# Patient Record
Sex: Female | Born: 1970 | Race: White | Hispanic: No | Smoking: Current some day smoker
Health system: Southern US, Community
[De-identification: ages and names within clinical notes are randomized; demographics above are authoritative.]

## PROBLEM LIST (undated history)

## (undated) DIAGNOSIS — T4145XA Adverse effect of unspecified anesthetic, initial encounter: Secondary | ICD-10-CM

## (undated) DIAGNOSIS — R200 Anesthesia of skin: Secondary | ICD-10-CM

## (undated) DIAGNOSIS — G8929 Other chronic pain: Secondary | ICD-10-CM

## (undated) DIAGNOSIS — M1990 Unspecified osteoarthritis, unspecified site: Secondary | ICD-10-CM

## (undated) DIAGNOSIS — E119 Type 2 diabetes mellitus without complications: Secondary | ICD-10-CM

## (undated) DIAGNOSIS — D649 Anemia, unspecified: Secondary | ICD-10-CM

## (undated) DIAGNOSIS — J45909 Unspecified asthma, uncomplicated: Secondary | ICD-10-CM

## (undated) DIAGNOSIS — T8859XA Other complications of anesthesia, initial encounter: Secondary | ICD-10-CM

## (undated) DIAGNOSIS — I10 Essential (primary) hypertension: Secondary | ICD-10-CM

## (undated) DIAGNOSIS — K219 Gastro-esophageal reflux disease without esophagitis: Secondary | ICD-10-CM

## (undated) DIAGNOSIS — M549 Dorsalgia, unspecified: Secondary | ICD-10-CM

## (undated) HISTORY — DX: Type 2 diabetes mellitus without complications: E11.9

## (undated) HISTORY — PX: ARM WOUND REPAIR / CLOSURE: SUR1141

## (undated) HISTORY — PX: OTHER SURGICAL HISTORY: SHX169

## (undated) HISTORY — PX: NECK SURGERY: SHX720

---

## 1999-11-26 ENCOUNTER — Encounter: Payer: Self-pay | Admitting: Emergency Medicine

## 1999-11-26 ENCOUNTER — Emergency Department (HOSPITAL_COMMUNITY): Admission: EM | Admit: 1999-11-26 | Discharge: 1999-11-26 | Payer: Self-pay | Admitting: Emergency Medicine

## 2007-01-09 ENCOUNTER — Inpatient Hospital Stay: Payer: Self-pay | Admitting: Unknown Physician Specialty

## 2008-09-20 ENCOUNTER — Ambulatory Visit: Payer: Self-pay | Admitting: Gastroenterology

## 2009-07-27 ENCOUNTER — Emergency Department: Payer: Self-pay

## 2009-08-09 ENCOUNTER — Ambulatory Visit: Payer: Self-pay | Admitting: Gastroenterology

## 2009-12-07 ENCOUNTER — Ambulatory Visit: Payer: Self-pay | Admitting: Gastroenterology

## 2010-03-29 ENCOUNTER — Ambulatory Visit: Payer: Self-pay | Admitting: Gastroenterology

## 2011-02-13 ENCOUNTER — Emergency Department: Payer: Self-pay | Admitting: Emergency Medicine

## 2011-02-26 ENCOUNTER — Ambulatory Visit: Payer: Self-pay | Admitting: Internal Medicine

## 2011-02-27 ENCOUNTER — Emergency Department: Payer: Self-pay | Admitting: Emergency Medicine

## 2011-03-14 ENCOUNTER — Ambulatory Visit: Payer: Self-pay

## 2011-07-11 ENCOUNTER — Ambulatory Visit: Payer: Self-pay | Admitting: Unknown Physician Specialty

## 2011-07-18 ENCOUNTER — Ambulatory Visit: Payer: Self-pay | Admitting: Unknown Physician Specialty

## 2012-04-20 ENCOUNTER — Ambulatory Visit: Payer: Self-pay | Admitting: Internal Medicine

## 2012-06-17 ENCOUNTER — Ambulatory Visit: Payer: Self-pay | Admitting: Orthopedic Surgery

## 2012-06-17 LAB — BASIC METABOLIC PANEL
Anion Gap: 7 (ref 7–16)
BUN: 10 mg/dL (ref 7–18)
Calcium, Total: 9.3 mg/dL (ref 8.5–10.1)
Chloride: 107 mmol/L (ref 98–107)
Co2: 21 mmol/L (ref 21–32)
Creatinine: 0.77 mg/dL (ref 0.60–1.30)
EGFR (African American): >60
EGFR (Non-African Amer.): >60
Glucose: 109 mg/dL — ABNORMAL HIGH (ref 65–99)
Osmolality: 270 (ref 275–301)
Potassium: 3.9 mmol/L (ref 3.5–5.1)
Sodium: 135 mmol/L — ABNORMAL LOW (ref 136–145)

## 2012-06-17 LAB — HEMOGLOBIN: HGB: 15.2 g/dL (ref 12.0–16.0)

## 2012-06-30 ENCOUNTER — Ambulatory Visit: Payer: Self-pay | Admitting: Orthopedic Surgery

## 2012-06-30 LAB — PREGNANCY, URINE: Pregnancy Test, Urine: NEGATIVE m[IU]/mL

## 2012-08-03 ENCOUNTER — Inpatient Hospital Stay: Payer: Self-pay | Admitting: Internal Medicine

## 2012-08-03 LAB — CBC
HCT: 33.8 % — ABNORMAL LOW (ref 40.0–52.0)
HGB: 11.8 g/dL — ABNORMAL LOW (ref 12.0–16.0)
MCH: 30 pg (ref 26.0–34.0)
MCHC: 34.8 g/dL (ref 32.0–36.0)
MCV: 86 fL (ref 80–100)
Platelet: 237 10*3/uL (ref 150–440)
RBC: 3.93 10*6/uL — ABNORMAL LOW (ref 4.40–5.90)
RDW: 14 % (ref 11.5–14.5)
WBC: 7.1 10*3/uL (ref 3.8–10.6)

## 2012-08-03 LAB — COMPREHENSIVE METABOLIC PANEL
Albumin: 3.5 g/dL (ref 3.4–5.0)
Alkaline Phosphatase: 81 U/L (ref 50–136)
Anion Gap: 10 (ref 7–16)
BUN: 10 mg/dL (ref 7–18)
Bilirubin,Total: 0.2 mg/dL (ref 0.2–1.0)
Calcium, Total: 8.8 mg/dL (ref 8.5–10.1)
Chloride: 110 mmol/L — ABNORMAL HIGH (ref 98–107)
Co2: 22 mmol/L (ref 21–32)
Creatinine: 0.71 mg/dL (ref 0.60–1.30)
EGFR (African American): >60
EGFR (Non-African Amer.): >60
Glucose: 157 mg/dL — ABNORMAL HIGH (ref 65–99)
Osmolality: 285 (ref 275–301)
Potassium: 3.7 mmol/L (ref 3.5–5.1)
SGOT(AST): 19 U/L (ref 15–37)
SGPT (ALT): 32 U/L (ref 12–78)
Sodium: 142 mmol/L (ref 136–145)
Total Protein: 7 g/dL (ref 6.4–8.2)

## 2012-08-03 LAB — TROPONIN I: Troponin-I: <0.02 ng/mL

## 2012-08-03 LAB — CK TOTAL AND CKMB (NOT AT ARMC)
CK, Total: 92 U/L (ref 21–232)
CK-MB: 1.5 ng/mL (ref 0.5–3.6)

## 2012-08-04 LAB — LIPID PANEL
Cholesterol: 144 mg/dL (ref 0–200)
HDL Cholesterol: 23 mg/dL — ABNORMAL LOW (ref 40–60)
Triglycerides: 457 mg/dL — ABNORMAL HIGH (ref 0–200)

## 2012-08-04 LAB — HEMOGLOBIN A1C: Hemoglobin A1C: 6 % (ref 4.2–6.3)

## 2012-08-04 LAB — TSH: Thyroid Stimulating Horm: 1.52 u[IU]/mL

## 2012-08-04 LAB — T4, FREE: Free Thyroxine: 0.86 ng/dL (ref 0.76–1.46)

## 2012-08-05 LAB — SEDIMENTATION RATE: Erythrocyte Sed Rate: 46 mm/hr — ABNORMAL HIGH (ref 0–20)

## 2012-08-06 LAB — BASIC METABOLIC PANEL
Anion Gap: 8 (ref 7–16)
BUN: 11 mg/dL (ref 7–18)
Calcium, Total: 9.4 mg/dL (ref 8.5–10.1)
Chloride: 104 mmol/L (ref 98–107)
Co2: 26 mmol/L (ref 21–32)
Glucose: 113 mg/dL — ABNORMAL HIGH (ref 65–99)
Osmolality: 276 (ref 275–301)
Potassium: 4.2 mmol/L (ref 3.5–5.1)
Sodium: 138 mmol/L (ref 136–145)

## 2012-08-06 LAB — CBC WITH DIFFERENTIAL/PLATELET
Basophil %: 0.6 %
Eosinophil #: 0.1 10*3/uL (ref 0.0–0.7)
Eosinophil %: 1.7 %
HGB: 12.9 g/dL (ref 12.0–16.0)
Lymphocyte #: 2.1 10*3/uL (ref 1.0–3.6)
MCH: 29.6 pg (ref 26.0–34.0)
MCV: 85 fL (ref 80–100)
Monocyte #: 0.5 10*3/uL (ref 0.2–1.0)
Platelet: 285 10*3/uL (ref 150–440)
RBC: 4.35 10*6/uL — ABNORMAL LOW (ref 4.40–5.90)

## 2012-08-06 LAB — TSH: Thyroid Stimulating Horm: 3.95 u[IU]/mL

## 2012-10-12 ENCOUNTER — Emergency Department: Payer: Self-pay | Admitting: Emergency Medicine

## 2013-07-03 ENCOUNTER — Ambulatory Visit: Payer: Self-pay | Admitting: Neurology

## 2013-10-12 ENCOUNTER — Other Ambulatory Visit: Payer: Self-pay | Admitting: Neurosurgery

## 2013-10-12 DIAGNOSIS — M5416 Radiculopathy, lumbar region: Secondary | ICD-10-CM

## 2014-03-16 ENCOUNTER — Ambulatory Visit: Payer: Self-pay | Admitting: Pain Medicine

## 2014-03-16 LAB — HEPATIC FUNCTION PANEL A (ARMC)
Albumin: 4.2 g/dL (ref 3.4–5.0)
Alkaline Phosphatase: 54 U/L
Bilirubin, Direct: <0.1 mg/dL (ref 0.00–0.20)
Bilirubin,Total: 0.2 mg/dL (ref 0.2–1.0)
SGOT(AST): 23 U/L (ref 15–37)
SGPT (ALT): 25 U/L (ref 12–78)
TOTAL PROTEIN: 7.8 g/dL (ref 6.4–8.2)

## 2014-03-16 LAB — BASIC METABOLIC PANEL
Anion Gap: 8 (ref 7–16)
BUN: 17 mg/dL (ref 7–18)
CALCIUM: 10 mg/dL (ref 8.5–10.1)
CREATININE: 0.99 mg/dL (ref 0.60–1.30)
Chloride: 105 mmol/L (ref 98–107)
Co2: 23 mmol/L (ref 21–32)
Glucose: 194 mg/dL — ABNORMAL HIGH (ref 65–99)
Osmolality: 279 (ref 275–301)
Potassium: 4 mmol/L (ref 3.5–5.1)
Sodium: 136 mmol/L (ref 136–145)

## 2014-03-16 LAB — SEDIMENTATION RATE: ERYTHROCYTE SED RATE: 12 mm/h (ref 0–20)

## 2014-03-16 LAB — MAGNESIUM: MAGNESIUM: 1.5 mg/dL — AB

## 2014-03-31 ENCOUNTER — Ambulatory Visit: Payer: Self-pay | Admitting: Pain Medicine

## 2014-04-11 ENCOUNTER — Ambulatory Visit: Payer: Self-pay | Admitting: Pain Medicine

## 2014-04-14 ENCOUNTER — Other Ambulatory Visit: Payer: Self-pay | Admitting: Pain Medicine

## 2014-04-14 LAB — HEMOGLOBIN A1C: HEMOGLOBIN A1C: 6.7 % — AB (ref 4.2–6.3)

## 2014-04-19 ENCOUNTER — Ambulatory Visit: Payer: Self-pay | Admitting: Pain Medicine

## 2014-05-09 ENCOUNTER — Ambulatory Visit: Payer: Self-pay | Admitting: Pain Medicine

## 2014-05-24 ENCOUNTER — Ambulatory Visit: Payer: Self-pay | Admitting: Pain Medicine

## 2014-06-22 ENCOUNTER — Ambulatory Visit: Payer: Self-pay | Admitting: Pain Medicine

## 2014-07-26 ENCOUNTER — Ambulatory Visit: Payer: Self-pay | Admitting: Pain Medicine

## 2014-08-31 ENCOUNTER — Ambulatory Visit: Payer: Self-pay | Admitting: Pain Medicine

## 2014-10-14 ENCOUNTER — Ambulatory Visit: Payer: Self-pay | Admitting: Pain Medicine

## 2014-11-18 ENCOUNTER — Ambulatory Visit: Payer: Self-pay | Admitting: Pain Medicine

## 2014-12-21 ENCOUNTER — Ambulatory Visit: Payer: Self-pay | Admitting: Internal Medicine

## 2015-01-06 ENCOUNTER — Emergency Department: Payer: Self-pay | Admitting: Emergency Medicine

## 2015-02-21 NOTE — Discharge Summary (Signed)
PATIENT NAME:  Colleen Mejia, Colleen Mejia MR#:  [id number] DATE OF BIRTH:  [birth year]  DATE OF ADMISSION:  08/03/2012 DATE OF DISCHARGE:  08/06/2012  ADMITTING PHYSICIAN:  Samson Frederic, DO  DISCHARGING PHYSICIAN: Gladstone Lighter, MD  PRIMARY CARE PHYSICIAN: Clayborn Bigness.  [place]:  Neurology consultation by Dr. Jennings Books   DISCHARGE DIAGNOSES:  1. Left-sided headache with facial numbness - presentation of atypical migraine.  2. Chronic nausea and dizziness.  3. Diabetic gastroparesis.  4. Diabetes mellitus with hemoglobin A1c of 6.0.  5. Hypothyroidism.  6. Hyperlipidemia.  7. Asthma.  8. Skin macular rash developed, possibly secondary to Fioricet. 9. Cocaine abuse.  10. History of irritable bowel syndrome with chronic diarrhea.   DISCHARGE MEDICATIONS:  1. Synthroid 50 mcg p.o. daily.  2. Singulair 10 mg p.o. daily.  3. Omeprazole 20 mg, 2 capsules in the morning.  4. Flonase 50 mcg nasal spray two sprays each nostril once in the morning.  5. Zetia 10 mg every day.  6. Glyburide-metformin 2.5 x 500 mg 1 tablet b.i.d.  7. Promethazine 25 mg every six hours as needed for nausea, and motion sickness.  8. Percocet 5/325 milligrams tablets 1 tablet every six hours p.r.n. for pain.  9. Prednisone 60 mg oral daily and taper off x 10 mg every day.   DISCHARGE DIET: Low-sodium diet.   DISCHARGE ACTIVITY: As tolerated.    FOLLOWUP INSTRUCTIONS:  1. Primary care physician follow-up in 1 to 2 weeks.  2. Follow up with neurologist, Dr. Jennings Books in two weeks.  3. Follow up with gastroenterology for chronic nausea in 2 to 3 weeks.    LABS AND IMAGING STUDIES:  1. WBC 8.2.  2. Hemoglobin 12.9, hematocrit 37.1, platelet count 285.  TSH is 3.95. 3. Sodium 138, potassium 4.2, chloride 104, bicarbonate 26, BUN 11, creatinine 0.85, glucose 113, calcium 9.4. 4. Vitamin B2 of was within normal limits at 369 pg/mL.  5. MRI of the brain with and without contrast showing  no acute infarct, no acute intracranial findings. No abnormal areas of  enhancement are seen  ESR slightly elevated at 46. ANA panel was negative. Ultrasound Doppler carotid bilaterally showing no evidence of hemodynamically significant carotid artery stenosis. Echo Doppler showing normal LV systolic function, EF greater than 55%, trace tricuspid regurgitation and mild mitral regurgitation are present. CK and CK-MB and troponins were within normal limits. LFTs were within normal limits as repeated. LDL elevated and cannot be calculated because  triglycerides 457. HbA1c 6.0.   BRIEF HOSPITAL COURSE: Ms. Shiroma is a 44 year old obese Caucasian female with past medical history significant for diabetes mellitus, hyperlipidemia, and history of cocaine abuse with irritable bowel syndrome and chronic nausea who presented to the ER secondary to left-sided facial numbness associated with headache and also and left arm and leg numbness. Her CT was negative on admission.  1. Left-sided paresthesias, initially admitted for possible transient ischemic attack. Had neuro checks. Her tingling, numbness, resolved. She had an MRI of the brain which did not show any acute infarct and there were no chronic microvascular ischemic changes as well. Echocardiogram and carotid Dopplers were normal as well. Her headache that was associated with her left facial numbness was very atypical. The patient denied any history of migraine so neurology was consulted. Per Dr Jennings Books, he also felt that this was an atypical migraine, and he ordered one dose of IV Depakote following which the next day the patient's headache improved a lot and  her facial numbness has resolved. She is being discharged on prednisone taper and will follow up with Dr. Manuella Ghazi as an outpatient.  2. Allergic reaction, the patient had a rash prior to discharge. The only new medication she has received was IV Depakote which was given more than 24 hours ago and she was also  getting p.r.n., Percocet and Fioricet of which she got two doses. Percocet, she did use in the past as she had a recent right knee surgery done for torn ACL ligament and she denies any allergies to that. However, her with some Benadryl and one dose of IV Solu-Medrol her rash has resolved and she wanted to go home. So Fioricet was discontinued at this time and she was asked to take as needed ibuprofen for her headache and follow-up with Dr. Jennings Books if her headache worsens. She was given the option of staying for one more night in the hospital to make sure that Fioricet was the only drug she is allergic to; however, the patient refused, as her rash improved. She wanted to go home.  3. Chronic nausea, dizziness, and irritable bowel syndrome. She says it has been going on for more than 10 years now. Phenergan  was prescribed for p.r.n. nausea symptoms and advised to follow up with gastroenterology. 4. Hypothyroidism and hyperlipidemia. All her home medications were continued.  5. Her course has been otherwise uneventful in the hospital.   DISCHARGE CONDITION: Stable.   DISCHARGE DISPOSITION: Home.   Time Spent ON discharge: 40 minutes.  ____________________________ Gladstone Lighter, MD rk:ljs D: 08/13/2012 14:47:42 ET T: 08/14/2012 11:39:11 ET JOB#: [id number]  cc: Gladstone Lighter, MD, <Dictator> Hemang K. Manuella Ghazi, MD Lavera Guise, MD Gladstone Lighter MD ELECTRONICALLY SIGNED 08/15/2012 7:28

## 2015-02-21 NOTE — H&P (Signed)
PATIENT NAME:  Colleen Mejia, Colleen Mejia MR#:  [id number] DATE OF BIRTH:  [birth year]  DATE OF ADMISSION:  08/04/2012  PRIMARY CARE PHYSICIAN:  Dr. Clayborn Bigness.   CHIEF COMPLAINT: "I think I had a mini stroke on Friday."   HISTORY OF PRESENT ILLNESS: 44 year old female with history of diabetes mellitus, dyslipidemia who presents with constellation of neurological symptoms that developed four days prior to presentation. She reports that on Friday she suddenly developed cold sweats, felt her jaw drop towards the right side, had left facial numbness. The patient recalls that symptoms started with her dropping something she was holding in her left hand and then she developed slobbering, could not talk.  She was very confused. She could not talk. She could not move her eyes down and it lasted about 20 minutes. It resolved, but then recurred within another 15 minutes and persisted for about the same 20 to 30 minutes. After the episode, she felt very exhausted. She recalls that she tried to drink water, but gagged on it and actually subsequently just had emesis of all the water that she had attempted to drink. Since then she has had left facial numbness, ear fluttering, left-sided headaches and left neck soreness as well as imbalance. She presented to her primary care provider today who called EMS on her behalf and she was sent directly from the office to the Emergency Department via EMS.   The patient recalls that preceding these events she had had about 2 to 3 weeks of diarrhea associated with abdominal pain. She could not eat at all and she estimates that she might have lost about 20 pounds. She did not seek medical care during that time. She remembers that during that time she would have fevers and cold sweats intermittently and she thought she was having the flu.   In the Emergency Department we are being called to admit her for concern of cerebrovascular accident versus cerebrovascular accident/transient ischemic  attack.   The patient does admit to daily tobacco use. She admits to smoking cocaine. However, she notes that she did not have any cocaine or illicit drugs prior to developing the above neurological symptoms.   PAST MEDICAL HISTORY:  1. Diabetes mellitus.  2. Dyslipidemia.  3. Asthma.  4. Hypothyroidism.  5. Substance abuse (cocaine).  6. Internal hemorrhoids.  7. History of irritable bowel syndrome with chronic diarrhea. 8. Tobacco abuse.  PAST SURGICAL HISTORY:  1. Right anterior cruciate ligament repair three weeks ago 08/27.  2. Anterior cervical diskectomy with fusion C6-C7 with insertion of interbody plate device.  3. Colonoscopy.  ALLWERGIES: NO KNOWN DRUG ALLERGIES. SEASONAL ALLERGIES.  MEDICATIONS:  1. Metformin/Glyburide 500 mg/2.5 mg twice a day.  2. Zetia 10 mg daily.  3. Singulair 10 mg daily.  4. Flonase 50 mcg two sprays in each nostril daily.  5. Omeprazole 20 mg daily.  6. Synthroid 50 micrograms daily.   FAMILY HISTORY:  Diabetes in grandmother, father and cousins. Maternal grandmother had lung cancer. She denies family history of cerebrovascular accident, myocardial infarction, bleeding or clotting disorders.   SOCIAL HISTORY: 10 cigarettes per day since age 62. She denies alcohol. Admits to illicit drug use. Last use was a few days ago and was cocaine which she smokes. She denies IV drug abuse.   REVIEW OF SYSTEMS: CONSTITUTIONAL: Admits to fever, chills, fatigue, weight loss, left-sided weakness. EYES: She admits to chronic blurred vision. She says it is always blurred due to her diabetes. Denies eye pain. ENT: Admits to  some ear ringing since the symptoms developed four days ago and some imbalance. RESPIRATORY: Has intermittent wheeze and dyspnea due to asthma, but no frank cough. CARDIOVASCULAR: Denies chest pain, edema or arrhythmia. GASTROINTESTINAL: Admits to nausea, vomiting x1 and profuse diarrhea over the preceding three weeks that has resolved, also  associated with some abdominal pain. She denies any blood. GU: Denies dysuria. ENDOCRINE: Admits to cold sweats. MUSCULOSKELETAL: Admits to left neck pain as well as right knee pain. The right knee pain is due to recent knee surgery. NEUROLOGIC: Admits to numbness in her face, weakness in the left arm, dysarthria, headaches, and feeling confused.   PHYSICAL EXAMINATION:    VITAL SIGNS: Temperature 98.4, blood pressure 125/81, heart rate 87, respiratory rate 18, sating 97% on room air.   GENERAL: She is a well-appearing Caucasian female in no apparent distress. She is obese.   EYES: Pupils equally round and reactive to light and accommodation. Extraocular muscles are intact. Anicteric sclerae. Normal eyelids.   ENT: Normal external ears and nares. Posterior oropharynx is clear. There was slight tongue deviation to the right, very slight.   CARDIOVASCULAR: S1 and S2, regular rate and rhythm. No murmurs appreciated. There is no pretibial edema.   RESPIRATORY: Clear to auscultation bilaterally. There is normal respiratory effort.   ABDOMEN: Soft, nontender, nondistended. There is no hepatosplenomegaly noted.   NEUROLOGIC: There is no dysarthria. Extraocular muscles are intact. There is facial numbness on the left side. The patient characterizes this "feeling like I'm at the dentist and just got a Novocaine injection. I can feel it, but it feels different". She has equal sternocleidomastoid strength and shoulder shrug. There is equal palatal lift. Hearing is intact bilaterally. There is slight tongue deviation to the right. She has full strength in right upper extremity proximal and distally in flexion and extension. However, she has decreased hand grip in the left side. Sensation is intact and strength is intact bilateral lower extremities, although exam is limited on the right lower extremity due to her recent knee surgery and she had some pain there. She had slight pronator drift and slight  dysdiadochokinesia on finger-to-nose with the right-hand. Heel-to-shin and rapid alternating movements were within normal limits, although again, heel to shin for the right lower extremity was limited due to her recent knee surgery.   SKIN: Warm and dry. No lesions. There are some mild papules similar to acne on her upper back.   LYMPHATICS: There is no cervical or inguinal adenopathy noted.   PSYCH: She is awake, alert, oriented to time, place, and situation. Judgment appears intact currently.   MUSCULOSKELETAL: She has normal tone. There is no cyanosis noted. There is no clubbing.   LABORATORY, RADIOLOGICAL AND DIAGNOSTIC DATA: CBC shows WBC count 7.1, hemoglobin 11.8, hematocrit 33.8, platelet count 237, MCV of 86. Troponin-I is less than 0.02. CK 92, CK-MB 1.5. BMP shows glucose 157, BUN 10, creatinine 0.71, sodium 142, potassium 3.7, chloride 110, bicarbonate 22, calcium 8.8, bilirubin 0.2, alkaline phosphatase 81, ALT 32, AST 19, total protein 7, albumin 3.5, osmolality 285, anion gap of 10. CT of head without contrast shows no acute intracranial process. No evidence of mass effect, midline shift or extra-axial fluid collection. There is no evidence of space-occupying lesion, intracranial hemorrhage or cortical-based area of infarction. Chest x-ray shows no evidence of acute cardiopulmonary abnormality.   ASSESSMENT AND PLAN: 44 year old female with diabetes, dyslipidemia, tobacco abuse, cocaine abuse presenting with persistent facial numbness and resolved left-sided weakness, dysarthria, confusion after  four days. Concerning for transient ischemic attack versus cerebrovascular accident.  1. Transient ischemic attack/cerebrovascular accident. The patient has multiple risk factors for developing a cerebrovascular accident including her dyslipidemia, her diabetes, her cocaine abuse, as well as her tobacco use. At this time, we are unable to obtain an MRI as she reportedly had a metal rod in her neck  postop. At this time we will check carotid ultrasounds, check an echocardiogram to rule out embolic event from either possible endocarditis given this diarrheal prodrome illnesses that she reports. I doubt atrial fibrillation is a cause here. However, given her hypothyroidism, we will check a TSH and a FT4. The patient admits to cocaine use. Counseling was provided and she did not accept help, but states that she is going to quit after this event. We will risk stratify her, check fasting lipid panel and hemoglobin A1c. We will start her on a statin. Her blood pressure is currently controlled. We will place her on telemetry to rule out any arrhythmia. Start aspirin. 2. Diabetes mellitus. We will resume her metformin and glyburide.  3. Dyslipidemia. We will continue her Zetia and start statin.  4. Asthma. Resume her Singulair and Flonase.  5. Hypothyroidism. We will resume her Synthroid. Check FT4 and TSH.  6. Possible reflux. We will resume her omeprazole.  7. Substance abuse. Counseling was provided. However, the patient reports that she will quit on her own.  8. Tobacco abuse. Nicotine patch was offered, however, the patient refused stating that "it makes me sick".  9. Prophylaxis. Will be provided with Lovenox.   DISPOSITION: The patient is being admitted inpatient for work-up of transient ischemic attack/cerebrovascular accident.   TIME SPENT: 60 minutes.   ____________________________ Samson Frederic, DO aeo:ap D: 08/03/2012 23:43:28 ET T: 08/04/2012 07:39:45 ET JOB#: [id number]  cc: Samson Frederic, DO, <Dictator> Lavera Guise, MD [place] SIGNED 08/09/2012 0:57

## 2015-02-21 NOTE — Consult Note (Signed)
Brief Consult Note: Diagnosis: left face numbess.   Patient was seen by consultant.   Consult note dictated.   Comments: - migraine ? (very atypical presentation) - ordered MRI brain with and without contrast - for evaluation fo stroke/demyelinating disease etc. 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  Electronic Signatures: Ray Church (MD)  (Signed 01-Oct-13 20:59)  Authored: Brief Consult Note   Last Updated: 01-Oct-13 20:59 by Ray Church (MD)

## 2015-02-21 NOTE — Consult Note (Signed)
PATIENT NAME:  Colleen, Mejia MR#:  [id number] DATE OF BIRTH:  [birth year]  DATE OF CONSULTATION:  08/04/2012  REFERRING PHYSICIAN:  Dr. Levonne Hubert  CONSULTING PHYSICIAN:  Priscille Shadduck K. Manuella Ghazi, MD  REASON FOR CONSULTATION: Left-sided face numbness.   HISTORY OF PRESENT ILLNESS: Colleen Mejia is a 44 year old Caucasian female who started having problems in the early part of September 2013. Initially she felt like she was having significant diarrhea lasting for three weeks and not much nausea but she lost almost 20 pounds during this time.   On Friday, 07/31/2012, she noticed sudden onset of numbness on the left side of her face, left side of the tongue. She felt her jaw was twisted to the right and she was having fluttering sensation in her left ear which lasted for 20 to 30 minutes and then it eased off for 10 to 15 minutes and had a similar spell. She felt exhausted. She "passed out". People told her to go to the ER but she did not go and after four days she went to see her primary care physician who recommended her to go the ER due to her persistent left face numbness.   Patient mentioned that she was having headache during this time period as well, which was associated with light sensitivity, noise sensitivity, irritability, some nausea.   Patient does have a history of rare headaches.   Patient mentioned she had a history of significant neck pain and degenerative disk disease and received ACDF by Dr. Mauri Pole in September 2012.   Patient denied any history of stroke.   Patient is also on disability due to mental health problems. Patient admits doing cocaine in the past.   PAST MEDICAL HISTORY:  1. Diabetes. 2. Hyperlipidemia. 3. Asthma. 4. Hypothyroidism. 5. Cocaine abuse.  6. Internal hemorrhoids. 7. History of irritable bowel syndrome. PAST SURGICAL HISTORY:  1. Right anterior cruciate ligament repair in August 2013.  2. ACDF at C6-C7.  3. Coloscopy.   MEDICATIONS: I reviewed her home  medication list.   FAMILY HISTORY: Significant for diabetes in the mother, father and cousins. Maternal grandmother had lung cancer. She denied any history of stroke MI, bleeding or clotting disorders.   SOCIAL HISTORY: Significant that she smokes 10 cigarettes per day starting at the age of 56. She denies any alcohol. She says yes to illicit drug use with cocaine.   She denied any IV drug abuse.   She lives in an apartment.   REVIEW OF SYSTEMS: Positive for fluttering sensation in the left ear, numbness on the left side of the face, numbness in the right hand, right knee pain. She has irritability to light and noise. She has pain in her neck. Her other 10 system review of systems was asked and was found to be negative.    PHYSICAL EXAMINATION:  VITAL SIGNS: Temperature 96.4, pulse 69, respiratory rate 21, blood pressure 144/84, temperature 98.   GENERAL: She is an obese Caucasian female lying in the bed, not in acute distress. She has a heating pad on her right side of her shoulder.   Patient has multiple scars on her body in her right elbow and right level of the hand region.    She has local tenderness to touch in her right knee.   LUNGS: Clear to auscultation.   HEART: S1, S2 heart sounds. Carotid exam did not reveal any bruit.   NEUROLOGIC: She was alert, oriented, followed two-step inverted commands. Her attention, concentration, and memory was appropriate. I did  not see any neurological neglect.   On her cranial nerves, her pupils are equal, round, and reactive. Extraocular movements are intact. Her face was symmetric. Tongue was midline. Facial sensations are decreased on the left side of the face and the left side of the tongue and inside of the cheek.   Her hearing was intact. Her neck strength was 5/5.   She does have history of scar in her anterior neck region.   On her motor exam she has normal tone. She has restricted efforts on her right lower extremity due to pain.    Otherwise, she has 5/5 strength.   Her sensations were intact to light touch except her right forearm and hand.   Her deep tendon reflexes were symmetric. Her gait was normal.   LABORATORY, DIAGNOSTIC AND RADIOLOGICAL DATA:  Her CT scan of the head was unremarkable.   She has significantly high triglyceride of 457 with low HDL of 23. Otherwise her thyroid function test is okay. Her liver enzymes are okay. She has a low hemoglobin of 11.8.   Echocardiogram was unremarkable.   ASSESSMENT AND PLAN:  1. Left-sided face numbness which is a persistent symptom associated with very unusual spell headache with some migrainous features.   I would like to rule out ischemic disease with MRI of the brain.   I will also give contrast to make sure patient does not have demyelinating disease.   Patient initially thought that she has a contraindication to the MRI due to "metal in her neck" but all the newer metals are compatible with MRI and her surgery was just done in 2012 by Dr. Mauri Pole so I think she should be able to tolerate the MRI.   If her work-up is negative this might represent migraine-related phenomenon.   I will also order vitamin B12, ANA and rheumatoid factor.   2. History of ACDF with some persistent neck pain.  3. Posttraumatic right forearm and hand numbness after surgery.   4. Mental health. Patient is on disability for last three years. Initially she was diagnosed with " schizophrenia" by Dr. 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    This is per patient, I have not reviewed any of her psychiatric notes.   I advised patient that she should have continuous follow up.   I also advised patient on avoiding cocaine, not smoking. She has severe hyperlipidemia and puts her at risk of cerebrovascular and cardiovascular complications in long term. She should be considered on statin.   I will follow this patient with you  less frequently. Feel free to contact me with any further questions.  ____________________________ Royetta Crochet. Manuella Ghazi, MD hks:cms D: 08/04/2012 21:08:38 ET T: 08/05/2012 08:47:06 ET JOB#: [id number]  cc: Omarius Grantham K. Manuella Ghazi, MD, <Dictator> Royetta Crochet [facility] MD ELECTRONICALLY SIGNED 08/06/2012 17:24

## 2015-02-21 NOTE — Op Note (Signed)
PATIENT NAME:  Colleen Mejia, DELEHANTY MR#:  [id number] DATE OF BIRTH:  [birth year]  DATE OF PROCEDURE:  06/30/2012  PREOPERATIVE DIAGNOSIS: Right knee anterior cruciate ligament tear.   POSTOPERATIVE DIAGNOSIS: Right knee anterior cruciate ligament tear.  PROCEDURE: Arthroscopically aided anterior cruciate ligament reconstruction right knee.   SURGEON: Laurene Footman, MD  ANESTHESIA: General.   DESCRIPTION OF PROCEDURE: Patient brought to the Operating Room and after adequate anesthesia was obtained, the right leg was prepped and draped in the usual sterile fashion with the Alvarado legholder utilized with a tourniquet applied to the upper thigh, but not required during the procedure. After preparing the graft using tibialis anterior tendon and the [facility] AperFix system, timeout procedure was carried out and an inferolateral portal was made. On inspection of the knee there was mild patellofemoral chondromalacia. Coming around medially, an inferomedial portal was made. The medial meniscus was intact. In the notch, the anterior cruciate ligament was torn in its mid substance and laterally the lateral compartment was also intact and normal other than some very slight chondromalacia on both femoral and tibial condyles which was also true on the medial side. The gutters were free of any loose bodies. Initial part of procedure involved resecting the torn anterior cruciate ligament from both tibial and femoral sides. After adequate resection had been carried out going through the old footprint on the femoral side a 6 mm guide was inserted and guide pin was inserted with the knee in 120 degrees flexion through the anteromedial portal drilled to 35 mm. The arthroscope was used to check and there was a good blind tunnel present. Next using the tibial guide at 55 degrees a small incision was made and a guidewire inserted through the stump of the anterior cruciate ligament on the tibial side and a 10 mm drill hole made  here as well. The shaver was then used to debride this area around the hole to allow for passage of the implant and graft. The AperFix femoral implant 10 x 29 mm was then inserted into the femoral tunnel to the appropriate level and tightened and on pull test it was stable. A suture had been passed through the tibial tunnel and out the anterior medial portal. This was used to pull the sutures for the three strands of the tendon down through the tibial tunnel. The AperFix 2 cannulated tibial peak implant was then inserted with tension on the grafts at 90 degrees of flexion with a 10 x 30 mm implant inserted. On inspection of the knee the anterior cruciate ligament appeared to be appropriately tensioned. There is no impingement in full extension. There was excellent stability in both 90 and 30 degrees of flexion. The knee was thoroughly irrigated. Excess tendon removed from the end of the tibia tunnel. The wounds were then closed with 2-0 Vicryl subcutaneously and 4-0 nylon for the skin. 20 mL of 0.5% Sensorcaine and 10 mg of morphine were infiltrated into the knee at the close of the case. Sterile dressings of Xeroform, 4 x 4's, Webril, Ace wrap, Polar Care and knee immobilizer were then applied with the knee locked in extension.   ESTIMATED BLOOD LOSS: Minimal.   COMPLICATIONS: None.   SPECIMEN: None.   IMPLANTS: AperFix femoral implant 10 x 29, AperFix 2 cannulated tibial peak implant 10 x 30 mm, with an anterior tibialis allograft tendon.   ____________________________ Laurene Footman, MD mjm:cms D: 06/30/2012 19:02:56 ET T: 07/01/2012 10:14:04 ET JOB#: [id number]  cc: Laurene Footman, MD, <  Dictator>  Laurene Footman MD ELECTRONICALLY SIGNED 07/01/2012 12:03
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
## 2015-07-28 ENCOUNTER — Encounter: Payer: Self-pay | Admitting: Emergency Medicine

## 2015-07-28 ENCOUNTER — Emergency Department
Admission: EM | Admit: 2015-07-28 | Discharge: 2015-07-28 | Disposition: A | Discharge status: Home / Self Care | Payer: Medicare Other | Attending: Emergency Medicine | Admitting: Emergency Medicine

## 2015-07-28 DIAGNOSIS — I10 Essential (primary) hypertension: Secondary | ICD-10-CM | POA: Diagnosis not present

## 2015-07-28 DIAGNOSIS — M62830 Muscle spasm of back: Secondary | ICD-10-CM

## 2015-07-28 DIAGNOSIS — Z720 Tobacco use: Secondary | ICD-10-CM | POA: Diagnosis not present

## 2015-07-28 DIAGNOSIS — M549 Dorsalgia, unspecified: Secondary | ICD-10-CM | POA: Diagnosis present

## 2015-07-28 HISTORY — DX: Unspecified osteoarthritis, unspecified site: M19.90

## 2015-07-28 HISTORY — DX: Essential (primary) hypertension: I10

## 2015-07-28 HISTORY — DX: Other chronic pain: G89.29

## 2015-07-28 HISTORY — DX: Dorsalgia, unspecified: M54.9

## 2015-07-28 MED ORDER — DIAZEPAM 5 MG PO TABS
5.0000 mg | ORAL_TABLET | Freq: Once | ORAL | Status: AC
  Administered 2015-07-28: 5 mg via ORAL
  Filled 2015-07-28: qty 1

## 2015-07-28 MED ORDER — DIAZEPAM 2 MG PO TABS: 2.0000 mg | ORAL_TABLET | Freq: Three times a day (TID) | ORAL | Status: DC | PRN

## 2015-07-28 MED ORDER — NAPROXEN 500 MG PO TABS: 500.0000 mg | ORAL_TABLET | Freq: Two times a day (BID) | ORAL | Status: DC

## 2015-07-28 MED ORDER — NAPROXEN 500 MG PO TABS
500.0000 mg | ORAL_TABLET | Freq: Once | ORAL | Status: AC
  Administered 2015-07-28: 500 mg via ORAL
  Filled 2015-07-28: qty 1

## 2015-07-28 NOTE — ED Provider Notes (Signed)
[facility] Emergency Department Provider Note  ____________________________________________  Time seen: Approximately 7:03 PM  I have reviewed the triage vital signs and the nursing notes.   HISTORY  Chief Complaint Back Pain   HPI Colleen Mejia is a 44 y.o. female who reports to the emergency department for evaluation of back spasms. She states that the spasms are moving and changing places. She states that she has taken Flexeril, but it is not working. She takes hydrocodone that was prescribed to her by the pain clinic, but it is not improving the spasms.   Past Medical History  Diagnosis Date  . Chronic back pain   . Hypertension   . Osteoarthritis     There are no active problems to display for this patient.   History reviewed. No pertinent past surgical history.  Current Outpatient Rx  Name  Route  Sig  Dispense  Refill  . diazepam (VALIUM) 2 MG tablet   Oral   Take 1 tablet (2 mg total) by mouth every 8 (eight) hours as needed for anxiety.   30 tablet   0   . naproxen (NAPROSYN) 500 MG tablet   Oral   Take 1 tablet (500 mg total) by mouth 2 (two) times daily with a meal.   60 tablet   2     Allergies Review of patient's allergies indicates no known allergies.  History reviewed. No pertinent family history.  Social History Social History  Substance Use Topics  . Smoking status: Current Every Day Smoker  . Smokeless tobacco: None  . Alcohol Use: None    Review of Systems Constitutional: No recent illness. Eyes: No visual changes. ENT: No sore throat. Cardiovascular: Denies chest pain or palpitations. Respiratory: Denies shortness of breath. Gastrointestinal: No abdominal pain.  Genitourinary: Negative for dysuria. Musculoskeletal: Pain in back Skin: Negative for rash. Neurological: Negative for headaches, focal weakness or numbness. 10-point ROS otherwise  negative.  ____________________________________________   PHYSICAL EXAM:  VITAL SIGNS: ED Triage Vitals  Enc Vitals Group     BP 07/28/15 1114 117/96 mmHg     Pulse Rate 07/28/15 1114 91     Resp 07/28/15 1114 20     Temp 07/28/15 1114 98 F (36.7 C)     Temp Source 07/28/15 1114 Oral     SpO2 07/28/15 1114 98 %     Weight 07/28/15 1114 200 lb (90.719 kg)     Height 07/28/15 1114 5\' 4"  (1.626 m)     Head Cir --      Peak Flow --      Pain Score 07/28/15 1115 9     Pain Loc --      Pain Edu? --      Excl. in [place]? --     Constitutional: Alert and oriented. Well appearing and in no acute distress. Eyes: Conjunctivae are normal. EOMI. Head: Atraumatic. Nose: No congestion/rhinnorhea. Neck: No stridor.  Respiratory: Normal respiratory effort.   Musculoskeletal: Patient noted to be sitting in the wheelchair with her feet planted on the floor rolling herself around the room. Normal exam of the back. Neurologic:  Normal speech and language. No gross focal neurologic deficits are appreciated. Speech is normal. No gait instability. Skin:  Skin is warm, dry and intact. Atraumatic. Psychiatric: Mood and affect are normal. Speech and behavior are normal.  ____________________________________________   LABS (all labs ordered are listed, but only abnormal results are displayed)  Labs Reviewed - No data to display ____________________________________________  RADIOLOGY  Not indicated ____________________________________________   PROCEDURES  Procedure(s) performed: None   ____________________________________________   INITIAL IMPRESSION / ASSESSMENT AND PLAN / ED COURSE  Pertinent labs & imaging results that were available during my care of the patient were reviewed by me and considered in my medical decision making (see chart for details).  Patient was advised to follow-up with her primary care or pain management provider for symptoms that are not improving over the  next few days. She was advised to continue heat or ice. She was advised to return the emergency department for symptoms that change or worsen if she is unable schedule an appointment. ____________________________________________   FINAL CLINICAL IMPRESSION(S) / ED DIAGNOSES  Final diagnoses:  Muscle spasm of back       Victorino Dike, FNP 07/28/15 [place], MD 08/07/15 [id number]

## 2015-07-28 NOTE — ED Notes (Signed)
Reports lower back spasms, states he flexeril is not working.

## 2015-09-06 ENCOUNTER — Encounter: Payer: Self-pay | Admitting: Pain Medicine

## 2015-09-23 ENCOUNTER — Encounter: Payer: Self-pay | Admitting: Pain Medicine

## 2015-09-23 DIAGNOSIS — M545 Low back pain, unspecified: Secondary | ICD-10-CM | POA: Insufficient documentation

## 2015-09-23 DIAGNOSIS — F411 Generalized anxiety disorder: Secondary | ICD-10-CM | POA: Insufficient documentation

## 2015-09-23 DIAGNOSIS — G5620 Lesion of ulnar nerve, unspecified upper limb: Secondary | ICD-10-CM | POA: Insufficient documentation

## 2015-09-23 DIAGNOSIS — M47812 Spondylosis without myelopathy or radiculopathy, cervical region: Secondary | ICD-10-CM | POA: Insufficient documentation

## 2015-09-23 DIAGNOSIS — M7918 Myalgia, other site: Secondary | ICD-10-CM | POA: Insufficient documentation

## 2015-09-23 DIAGNOSIS — E039 Hypothyroidism, unspecified: Secondary | ICD-10-CM | POA: Insufficient documentation

## 2015-09-23 DIAGNOSIS — Z9289 Personal history of other medical treatment: Secondary | ICD-10-CM | POA: Insufficient documentation

## 2015-09-23 DIAGNOSIS — F329 Major depressive disorder, single episode, unspecified: Secondary | ICD-10-CM | POA: Insufficient documentation

## 2015-09-23 DIAGNOSIS — M549 Dorsalgia, unspecified: Secondary | ICD-10-CM

## 2015-09-23 DIAGNOSIS — M797 Fibromyalgia: Secondary | ICD-10-CM | POA: Insufficient documentation

## 2015-09-23 DIAGNOSIS — G5600 Carpal tunnel syndrome, unspecified upper limb: Secondary | ICD-10-CM | POA: Insufficient documentation

## 2015-09-23 DIAGNOSIS — F1990 Other psychoactive substance use, unspecified, uncomplicated: Secondary | ICD-10-CM | POA: Insufficient documentation

## 2015-09-23 DIAGNOSIS — M4802 Spinal stenosis, cervical region: Secondary | ICD-10-CM | POA: Insufficient documentation

## 2015-09-23 DIAGNOSIS — M25561 Pain in right knee: Secondary | ICD-10-CM

## 2015-09-23 DIAGNOSIS — Z5181 Encounter for therapeutic drug level monitoring: Secondary | ICD-10-CM | POA: Insufficient documentation

## 2015-09-23 DIAGNOSIS — F32A Depression, unspecified: Secondary | ICD-10-CM | POA: Insufficient documentation

## 2015-09-23 DIAGNOSIS — F319 Bipolar disorder, unspecified: Secondary | ICD-10-CM | POA: Insufficient documentation

## 2015-09-23 DIAGNOSIS — F419 Anxiety disorder, unspecified: Secondary | ICD-10-CM | POA: Insufficient documentation

## 2015-09-23 DIAGNOSIS — M5412 Radiculopathy, cervical region: Secondary | ICD-10-CM

## 2015-09-23 DIAGNOSIS — M5416 Radiculopathy, lumbar region: Secondary | ICD-10-CM

## 2015-09-23 DIAGNOSIS — E785 Hyperlipidemia, unspecified: Secondary | ICD-10-CM | POA: Insufficient documentation

## 2015-09-23 DIAGNOSIS — F1190 Opioid use, unspecified, uncomplicated: Secondary | ICD-10-CM | POA: Insufficient documentation

## 2015-09-23 DIAGNOSIS — J45909 Unspecified asthma, uncomplicated: Secondary | ICD-10-CM | POA: Insufficient documentation

## 2015-09-23 DIAGNOSIS — F1120 Opioid dependence, uncomplicated: Secondary | ICD-10-CM | POA: Insufficient documentation

## 2015-09-23 DIAGNOSIS — Z8659 Personal history of other mental and behavioral disorders: Secondary | ICD-10-CM | POA: Insufficient documentation

## 2015-09-23 DIAGNOSIS — G8929 Other chronic pain: Secondary | ICD-10-CM | POA: Insufficient documentation

## 2015-09-23 DIAGNOSIS — M961 Postlaminectomy syndrome, not elsewhere classified: Secondary | ICD-10-CM | POA: Insufficient documentation

## 2015-09-23 DIAGNOSIS — M47816 Spondylosis without myelopathy or radiculopathy, lumbar region: Secondary | ICD-10-CM | POA: Insufficient documentation

## 2015-09-23 DIAGNOSIS — M542 Cervicalgia: Secondary | ICD-10-CM

## 2015-09-23 DIAGNOSIS — M79603 Pain in arm, unspecified: Secondary | ICD-10-CM

## 2015-09-23 DIAGNOSIS — M79605 Pain in left leg: Secondary | ICD-10-CM

## 2015-09-23 DIAGNOSIS — E119 Type 2 diabetes mellitus without complications: Secondary | ICD-10-CM | POA: Insufficient documentation

## 2015-09-23 DIAGNOSIS — M792 Neuralgia and neuritis, unspecified: Secondary | ICD-10-CM | POA: Insufficient documentation

## 2015-09-23 DIAGNOSIS — Z79891 Long term (current) use of opiate analgesic: Secondary | ICD-10-CM | POA: Insufficient documentation

## 2015-09-23 DIAGNOSIS — M533 Sacrococcygeal disorders, not elsewhere classified: Secondary | ICD-10-CM

## 2015-09-23 DIAGNOSIS — I10 Essential (primary) hypertension: Secondary | ICD-10-CM | POA: Insufficient documentation

## 2015-09-23 DIAGNOSIS — M25562 Pain in left knee: Secondary | ICD-10-CM

## 2015-09-23 DIAGNOSIS — K219 Gastro-esophageal reflux disease without esophagitis: Secondary | ICD-10-CM | POA: Insufficient documentation

## 2015-09-23 DIAGNOSIS — M25559 Pain in unspecified hip: Secondary | ICD-10-CM

## 2015-09-23 DIAGNOSIS — G8928 Other chronic postprocedural pain: Secondary | ICD-10-CM | POA: Insufficient documentation

## 2015-09-25 ENCOUNTER — Encounter: Payer: Self-pay | Admitting: Pain Medicine

## 2015-09-26 ENCOUNTER — Ambulatory Visit: Payer: Medicare Other | Attending: Pain Medicine | Admitting: Pain Medicine

## 2015-09-26 ENCOUNTER — Encounter: Payer: Self-pay | Admitting: Pain Medicine

## 2015-09-26 ENCOUNTER — Other Ambulatory Visit: Payer: Self-pay | Admitting: Pain Medicine

## 2015-09-26 VITALS — BP 126/75 | HR 78 | Temp 98.7°F | Resp 18 | Ht 64.0 in | Wt 195.0 lb

## 2015-09-26 DIAGNOSIS — E669 Obesity, unspecified: Secondary | ICD-10-CM | POA: Diagnosis not present

## 2015-09-26 DIAGNOSIS — F329 Major depressive disorder, single episode, unspecified: Secondary | ICD-10-CM | POA: Diagnosis not present

## 2015-09-26 DIAGNOSIS — M25519 Pain in unspecified shoulder: Secondary | ICD-10-CM | POA: Diagnosis present

## 2015-09-26 DIAGNOSIS — M79606 Pain in leg, unspecified: Secondary | ICD-10-CM | POA: Insufficient documentation

## 2015-09-26 DIAGNOSIS — M5416 Radiculopathy, lumbar region: Secondary | ICD-10-CM

## 2015-09-26 DIAGNOSIS — M961 Postlaminectomy syndrome, not elsewhere classified: Secondary | ICD-10-CM

## 2015-09-26 DIAGNOSIS — E119 Type 2 diabetes mellitus without complications: Secondary | ICD-10-CM | POA: Diagnosis not present

## 2015-09-26 DIAGNOSIS — G5603 Carpal tunnel syndrome, bilateral upper limbs: Secondary | ICD-10-CM | POA: Diagnosis not present

## 2015-09-26 DIAGNOSIS — Z7189 Other specified counseling: Secondary | ICD-10-CM

## 2015-09-26 DIAGNOSIS — I10 Essential (primary) hypertension: Secondary | ICD-10-CM | POA: Insufficient documentation

## 2015-09-26 DIAGNOSIS — M4802 Spinal stenosis, cervical region: Secondary | ICD-10-CM | POA: Insufficient documentation

## 2015-09-26 DIAGNOSIS — M549 Dorsalgia, unspecified: Secondary | ICD-10-CM | POA: Insufficient documentation

## 2015-09-26 DIAGNOSIS — M797 Fibromyalgia: Secondary | ICD-10-CM | POA: Diagnosis not present

## 2015-09-26 DIAGNOSIS — F1190 Opioid use, unspecified, uncomplicated: Secondary | ICD-10-CM | POA: Diagnosis not present

## 2015-09-26 DIAGNOSIS — M47816 Spondylosis without myelopathy or radiculopathy, lumbar region: Secondary | ICD-10-CM

## 2015-09-26 DIAGNOSIS — Z79891 Long term (current) use of opiate analgesic: Secondary | ICD-10-CM

## 2015-09-26 DIAGNOSIS — M791 Myalgia: Secondary | ICD-10-CM

## 2015-09-26 DIAGNOSIS — M5412 Radiculopathy, cervical region: Secondary | ICD-10-CM

## 2015-09-26 DIAGNOSIS — M545 Low back pain, unspecified: Secondary | ICD-10-CM

## 2015-09-26 DIAGNOSIS — E785 Hyperlipidemia, unspecified: Secondary | ICD-10-CM | POA: Diagnosis not present

## 2015-09-26 DIAGNOSIS — M4726 Other spondylosis with radiculopathy, lumbar region: Secondary | ICD-10-CM | POA: Diagnosis not present

## 2015-09-26 DIAGNOSIS — F319 Bipolar disorder, unspecified: Secondary | ICD-10-CM | POA: Insufficient documentation

## 2015-09-26 DIAGNOSIS — G8929 Other chronic pain: Secondary | ICD-10-CM | POA: Insufficient documentation

## 2015-09-26 DIAGNOSIS — F1120 Opioid dependence, uncomplicated: Secondary | ICD-10-CM

## 2015-09-26 DIAGNOSIS — E039 Hypothyroidism, unspecified: Secondary | ICD-10-CM | POA: Insufficient documentation

## 2015-09-26 DIAGNOSIS — J45909 Unspecified asthma, uncomplicated: Secondary | ICD-10-CM | POA: Insufficient documentation

## 2015-09-26 DIAGNOSIS — F1990 Other psychoactive substance use, unspecified, uncomplicated: Secondary | ICD-10-CM

## 2015-09-26 DIAGNOSIS — K219 Gastro-esophageal reflux disease without esophagitis: Secondary | ICD-10-CM | POA: Insufficient documentation

## 2015-09-26 DIAGNOSIS — M7918 Myalgia, other site: Secondary | ICD-10-CM

## 2015-09-26 DIAGNOSIS — Z5181 Encounter for therapeutic drug level monitoring: Secondary | ICD-10-CM

## 2015-09-26 DIAGNOSIS — Z79899 Other long term (current) drug therapy: Secondary | ICD-10-CM

## 2015-09-26 MED ORDER — HYDROCODONE-ACETAMINOPHEN 5-325 MG PO TABS: 1.0000 | ORAL_TABLET | Freq: Four times a day (QID) | ORAL | Status: DC | PRN

## 2015-09-26 MED ORDER — PREGABALIN 100 MG PO CAPS: 100.0000 mg | ORAL_CAPSULE | Freq: Three times a day (TID) | ORAL | Status: DC

## 2015-09-26 MED ORDER — TIZANIDINE HCL 4 MG PO TABS: 4.0000 mg | ORAL_TABLET | Freq: Four times a day (QID) | ORAL | Status: AC | PRN

## 2015-09-26 NOTE — Progress Notes (Signed)
Safety precautions to be maintained throughout the outpatient stay will include: orient to surroundings, keep bed in low position, maintain call bell within reach at all times, provide assistance with transfer out of bed and ambulation.  

## 2015-09-26 NOTE — Patient Instructions (Signed)
Facet Blocks Patient Information  Description: The facets are joints in the spine between the vertebrae.  Like any joints in the body, facets can become irritated and painful.  Arthritis can also effect the facets.  By injecting steroids and local anesthetic in and around these joints, we can temporarily block the nerve supply to them.  Steroids act directly on irritated nerves and tissues to reduce selling and inflammation which often leads to decreased pain.  Facet blocks may be done anywhere along the spine from the neck to the low back depending upon the location of your pain.   After numbing the skin with local anesthetic (like Novocaine), a small needle is passed onto the facet joints under x-ray guidance.  You may experience a sensation of pressure while this is being done.  The entire block usually lasts about 15-25 minutes.   Conditions which may be treated by facet blocks:   Low back/buttock pain  Neck/shoulder pain  Certain types of headaches  Preparation for the injection:  1. Do not eat any solid food or dairy products within 6 hours of your appointment. 2. You may drink clear liquid up to 2 hours before appointment.  Clear liquids include water, black coffee, juice or soda.  No milk or cream please. 3. You may take your regular medication, including pain medications, with a sip of water before your appointment.  Diabetics should hold regular insulin (if taken separately) and take 1/2 normal NPH dose the morning of the procedure.  Carry some sugar containing items with you to your appointment. 4. A driver must accompany you and be prepared to drive you home after your procedure. 5. Bring all your current medications with you. 6. An IV may be inserted and sedation may be given at the discretion of the physician. 7. A blood pressure cuff, EKG and other monitors will often be applied during the procedure.  Some patients may need to have extra oxygen administered for a short  period. 8. You will be asked to provide medical information, including your allergies and medications, prior to the procedure.  We must know immediately if you are taking blood thinners (like Coumadin/Warfarin) or if you are allergic to IV iodine contrast (dye).  We must know if you could possible be pregnant.  Possible side-effects:   Bleeding from needle site  Infection (rare, may require surgery)  Nerve injury (rare)  Numbness & tingling (temporary)  Difficulty urinating (rare, temporary)  Spinal headache (a headache worse with upright posture)  Light-headedness (temporary)  Pain at injection site (serveral days)  Decreased blood pressure (rare, temporary)  Weakness in arm/leg (temporary)  Pressure sensation in back/neck (temporary)   Call if you experience:   Fever/chills associated with headache or increased back/neck pain  Headache worsened by an upright position  New onset, weakness or numbness of an extremity below the injection site  Hives or difficulty breathing (go to the emergency room)  Inflammation or drainage at the injection site(s)  Severe back/neck pain greater than usual  New symptoms which are concerning to you  Please note:  Although the local anesthetic injected can often make your back or neck feel good for several hours after the injection, the pain will likely return. It takes 3-7 days for steroids to work.  You may not notice any pain relief for at least one week.  If effective, we will often do a series of 2-3 injections spaced 3-6 weeks apart to maximally decrease your pain.  After the initial   series, you may be a candidate for a more permanent nerve block of the facets.  Prescriptions given for tizanidine, lyrica and hydrocodone  If you have any questions, please call #[phone]) [facility]

## 2015-09-26 NOTE — Progress Notes (Signed)
Patient's Name: Colleen Mejia MRN: [id number] DOB: 1971/09/02 DOS: 09/26/2015  Primary Reason(s) for Visit: Encounter for Medication Management CC: Shoulder Pain; Back Pain; and Leg Pain   HPI:   Colleen Mejia is a 44 y.o. year old, female patient, who returns today as an established patient. She has Chronic pain; Chronic low back pain (L>R); Anxiety; Clinical depression; Diabetes mellitus, type 2 ([place]); Fibromyalgia; Acid reflux; HLD (hyperlipidemia); BP (high blood pressure); Long term current use of opiate analgesic; Long term prescription opiate use; Opiate use; Opiate dependence ([place]); Encounter for therapeutic drug level monitoring; Encounter for chronic pain management; Chronic neck pain; Cervical spondylosis; Musculoskeletal pain; Myofascial pain; Lumbar spondylosis; Chronic left lower extremity pain; Chronic lumbar radicular pain (Bilateral) (L>R); Chronic upper back pain; GERD (gastroesophageal reflux disease); History of psychiatric care; History of psychiatric disorder; Bipolar disorder ([place]); Bronchial asthma; Generalized anxiety disorder; Substance use disorder; Hypothyroidism; Lumbar facet syndrome; Chronic hip pain (Bilateral); Chronic sacroiliac joint pain (Bilateral); Chronic bilateral knee pain; 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 Her primarily concern today is the Shoulder Pain; Back Pain; and Leg Pain     The patient returns today indicating that she is having pain both in the cervical region as well as the lumbar region. The  worse seems to be in the lumbar region, but the cervical she describes as a new problem. There is a prior history of a failed neck surgery syndrome, which according to her seems to be getting worse. Because of this, we will go ahead and order an MRI of the cervical spine. She has requested that we bring her back for interventional treatment of the lower lumbar spine. In the lower area, the pain is primarily in the lower back with the right side today being worst on the left, despite the fact that it is usually the opposite. This seems to be secondary to a facet syndrome with significant pain on hyperextension and rotation. She continues to be obese, which is not helping with her low back pain.  Today's Pain Score: 8 . Clinically she looks like a 3-4/10. Reported level of pain is incompatible with clinical obrservations. This may be secondary to a possible lack of understanding on how the pain scale works. Pain Type: Chronic pain Pain Location: Back Pain Orientation: Lower, Mid Pain Descriptors / Indicators: Constant, Burning, Aching, Sharp, Throbbing Pain Frequency: Constant  Pharmacotherapy Review:   Side-effects or Adverse reactions: None reported. Effectiveness: Described as relatively effective, allowing for increase in activities of daily living (ADL). Onset of action: Within expected pharmacological parameters. Duration of action: Within normal limits for medication. Peak effect: Timing and results are as within normal expected parameters. [place] PMP: Compliant with practice rules and regulations. Medication Assessment Form: Reviewed. Patient indicates being compliant with therapy Treatment compliance: Compliant. Substance Use Disorder (SUD) Risk Level: Low Pharmacologic Plan: Continue therapy as is.  Last Available Lab Work: No visits with results within 3 Month(s) from this visit. Latest known visit with results is:  [facility] Conversion on 04/14/2014  Component Date Value Ref Range Status   . Hemoglobin A1C 04/14/2014 6.7* 4.2-6.3 % Final   Allergies:  Colleen Mejia has No Known Allergies.  Meds: The patient has a current medication list which includes the following prescription(s): albuterol, esomeprazole, hydrocodone-acetaminophen, ipratropium-albuterol, loratadine, losartan, methylphenidate, montelukast, pregabalin, quetiapine, and tizanidine. Requested Prescriptions   Signed Prescriptions Disp Refills  . pregabalin (LYRICA) 100 MG capsule 90 capsule 0    Sig: Take 1 capsule (100 mg total) by mouth 3 (three) times daily.  Marland Kitchen HYDROcodone-acetaminophen (NORCO/VICODIN) 5-325 MG tablet 120 tablet 0    Sig: Take 1 tablet by mouth every 6 (six) hours as needed for moderate pain.  Marland Kitchen tiZANidine (ZANAFLEX) 4 MG tablet 120 tablet 0    Sig: Take 1 tablet (4 mg total) by mouth every 6 (six) hours as needed for muscle spasms.    ROS: Constitutional: Afebrile, no chills, well hydrated and well nourished Gastrointestinal: negative Musculoskeletal:negative Neurological: negative Behavioral/Psych: negative  PFSH: Medical:  Colleen Mejia  has a past medical history of Chronic back pain; Hypertension; Osteoarthritis; and Diabetes mellitus without complication ([place]). Family: family history is not on file. Surgical:  has no past surgical history on file. Tobacco:  reports that she has been smoking.  She does not have any smokeless tobacco history on file. Alcohol:  has no alcohol history on file. Drug:  has no drug history on file.  Physical Exam: Vitals:  Today's Vitals   09/26/15 0923  BP: 126/75  Pulse: 78  Temp: 98.7 F (37.1 C)  TempSrc: Oral  Resp: 18  Height: 5\' 4"  (1.626 m)  Weight: 195 lb (88.451 kg)  SpO2: 100%  PainSc: 8   PainLoc: Back  Calculated BMI: Body mass index is 33.46 kg/(m^2). General appearance: alert, cooperative, appears stated age, mild distress and moderately obese Eyes: conjunctivae/corneas clear. PERRL, EOM's intact. Fundi benign. Lungs: No  evidence respiratory distress, no audible rales or ronchi and no use of accessory muscles of respiration Neck: Decreased range of motion of cervical spine with pain accompanying the rotational movement as well as the flexion and extension. Pain is in the neck as well as shoulders. There is some evidence of prior cervical surgery. Back: Low back pain on hyperextension and rotation suggestive facet disease. Extremities: extremities normal, atraumatic, no cyanosis or edema Pulses: 2+ and symmetric Skin: Skin color, texture, turgor normal. 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 -     tiZANidine (ZANAFLEX) 4 MG tablet; Take 1 tablet (4 mg total) by mouth every 6 (six) hours as needed for muscle spasms.  Musculoskeletal pain     Patient Instructions  Facet Blocks Patient Information  Description: The facets are joints in the spine between the vertebrae.  Like any joints in the body, facets can become irritated and painful.  Arthritis can also effect the facets.  By injecting steroids and local anesthetic in and around these joints, we can temporarily block the nerve supply to them.  Steroids act directly on irritated nerves and tissues to reduce selling and inflammation which often leads to decreased pain.  Facet blocks may be done anywhere along the spine from the neck to the low back depending upon the location of your pain.   After numbing the skin with local anesthetic (like Novocaine), a small needle is passed onto the facet joints under x-ray guidance.  You may experience a sensation of pressure while this is being done.  The entire block usually lasts about 15-25 minutes.   Conditions which may be treated by facet blocks:   Low back/buttock pain  Neck/shoulder pain  Certain types of headaches  Preparation for the injection:  1. Do not eat any solid food or dairy products within 6 hours of your appointment. 2. You may drink clear liquid up to 2 hours before appointment.  Clear liquids include water, black coffee, juice or soda.  No milk or cream please. 3. You may take your regular medication, including pain medications, with a sip of water before your  appointment.  Diabetics should hold regular insulin (if taken separately) and take 1/2 normal NPH dose the morning of the procedure.  Carry some sugar containing items with you to your appointment. 4. A driver must accompany you and be prepared to drive you home after your procedure. 5. Bring all your current medications with you. 6. An IV may be inserted and sedation may be given at the discretion of the physician. 7. A blood pressure cuff, EKG and other monitors will often be applied during the procedure.  Some patients may need to have extra oxygen administered for a short period. 8. You will be asked to provide medical information, including your allergies and medications, prior to the procedure.  We must know immediately if you are taking blood thinners (like Coumadin/Warfarin) or if you are allergic to IV iodine contrast (dye).  We must know if you could possible be pregnant.  Possible side-effects:   Bleeding from needle site  Infection (rare, may require surgery)  Nerve injury (rare)  Numbness & tingling (temporary)  Difficulty urinating (rare, temporary)  Spinal headache (a headache worse with upright posture)  Light-headedness (temporary)  Pain at injection site (serveral days)  Decreased blood pressure (  rare, temporary)  Weakness in arm/leg (temporary)  Pressure sensation in back/neck (temporary)   Call if you experience:   Fever/chills associated with headache or increased back/neck pain  Headache worsened by an upright position  New onset, weakness or numbness of an extremity below the injection site  Hives or difficulty breathing (go to the emergency room)  Inflammation or drainage at the injection site(s)  Severe back/neck pain greater than usual  New symptoms which are concerning to you  Please note:  Although the local anesthetic injected can often make your back or neck feel good for several hours after the injection, the pain will likely return.  It takes 3-7 days for steroids to work.  You may not notice any pain relief for at least one week.  If effective, we will often do a series of 2-3 injections spaced 3-6 weeks apart to maximally decrease your pain.  After the initial series, you may be a candidate for a more permanent nerve block of the facets.  Prescriptions given for tizanidine, lyrica and hydrocodone  If you have any questions, please call #[phone]) [facility]    Medications discontinued today:  Medications Discontinued During This Encounter  Medication Reason  . diazepam (VALIUM) 2 MG tablet Error  . naproxen (NAPROSYN) 500 MG tablet Error  . pregabalin (LYRICA) 100 MG capsule Reorder  . HYDROcodone-acetaminophen (NORCO/VICODIN) 5-325 MG tablet Reorder  . tiZANidine (ZANAFLEX) 4 MG tablet Reorder   Medications administered today:  Ms. Gummo had no medications administered during this visit.  Primary Care Physician: Tracie Harrier, MD Location: [facility] Note by: Kathlen Brunswick. Dossie Arbour, M.D, DABA, DABAPM, DABPM, DABIPP, FIPP

## 2015-10-05 ENCOUNTER — Ambulatory Visit: Payer: Medicare Other | Admitting: Pain Medicine

## 2015-10-06 LAB — TOXASSURE SELECT 13 (MW), URINE: PDF: 0

## 2015-10-10 ENCOUNTER — Encounter: Payer: Self-pay | Admitting: Pain Medicine

## 2015-10-10 DIAGNOSIS — F1490 Cocaine use, unspecified, uncomplicated: Secondary | ICD-10-CM | POA: Insufficient documentation

## 2015-10-10 NOTE — Progress Notes (Signed)
Quick Note:  The results of this test were unexpected. No therapeutic levels of the prescribed medication were found. Results will need to be reviewed with patient. Possible causes include: PRN use; increased intake with early depletion of medication; or opioid deveation. We will be paying attention to pill counts to assist with diferential. Upon the patient's return to the clinic we will discontinue the use of opioids and limit are therapy to interventional treatment only. ______

## 2015-10-13 ENCOUNTER — Other Ambulatory Visit: Payer: Self-pay | Admitting: Pain Medicine

## 2015-10-25 ENCOUNTER — Ambulatory Visit: Payer: Medicare Other | Attending: Pain Medicine | Admitting: Pain Medicine

## 2015-10-25 ENCOUNTER — Encounter: Payer: Self-pay | Admitting: Pain Medicine

## 2015-10-25 ENCOUNTER — Other Ambulatory Visit: Payer: Self-pay | Admitting: Pain Medicine

## 2015-10-25 VITALS — BP 129/75 | HR 80 | Temp 98.6°F | Resp 18 | Ht 64.0 in | Wt 200.0 lb

## 2015-10-25 DIAGNOSIS — F17200 Nicotine dependence, unspecified, uncomplicated: Secondary | ICD-10-CM | POA: Diagnosis not present

## 2015-10-25 DIAGNOSIS — F1490 Cocaine use, unspecified, uncomplicated: Secondary | ICD-10-CM

## 2015-10-25 DIAGNOSIS — G8929 Other chronic pain: Secondary | ICD-10-CM | POA: Insufficient documentation

## 2015-10-25 DIAGNOSIS — M542 Cervicalgia: Secondary | ICD-10-CM | POA: Diagnosis present

## 2015-10-25 DIAGNOSIS — M549 Dorsalgia, unspecified: Secondary | ICD-10-CM | POA: Insufficient documentation

## 2015-10-25 DIAGNOSIS — I10 Essential (primary) hypertension: Secondary | ICD-10-CM | POA: Insufficient documentation

## 2015-10-25 DIAGNOSIS — F329 Major depressive disorder, single episode, unspecified: Secondary | ICD-10-CM | POA: Diagnosis not present

## 2015-10-25 DIAGNOSIS — E119 Type 2 diabetes mellitus without complications: Secondary | ICD-10-CM | POA: Insufficient documentation

## 2015-10-25 DIAGNOSIS — F1410 Cocaine abuse, uncomplicated: Secondary | ICD-10-CM | POA: Diagnosis not present

## 2015-10-25 DIAGNOSIS — F319 Bipolar disorder, unspecified: Secondary | ICD-10-CM | POA: Diagnosis not present

## 2015-10-25 DIAGNOSIS — E039 Hypothyroidism, unspecified: Secondary | ICD-10-CM | POA: Insufficient documentation

## 2015-10-25 DIAGNOSIS — G5603 Carpal tunnel syndrome, bilateral upper limbs: Secondary | ICD-10-CM | POA: Insufficient documentation

## 2015-10-25 DIAGNOSIS — M25561 Pain in right knee: Secondary | ICD-10-CM | POA: Insufficient documentation

## 2015-10-25 DIAGNOSIS — K219 Gastro-esophageal reflux disease without esophagitis: Secondary | ICD-10-CM | POA: Insufficient documentation

## 2015-10-25 DIAGNOSIS — M4802 Spinal stenosis, cervical region: Secondary | ICD-10-CM | POA: Insufficient documentation

## 2015-10-25 DIAGNOSIS — F419 Anxiety disorder, unspecified: Secondary | ICD-10-CM | POA: Insufficient documentation

## 2015-10-25 DIAGNOSIS — Z7189 Other specified counseling: Secondary | ICD-10-CM | POA: Diagnosis not present

## 2015-10-25 DIAGNOSIS — E785 Hyperlipidemia, unspecified: Secondary | ICD-10-CM | POA: Insufficient documentation

## 2015-10-25 DIAGNOSIS — M25562 Pain in left knee: Secondary | ICD-10-CM | POA: Insufficient documentation

## 2015-10-25 NOTE — Progress Notes (Signed)
Patient's Name: Colleen Mejia MRN: [id number] DOB: 24-Feb-1971 DOS: 10/25/2015  Primary Reason(s) for Visit: Encounter for Medication Management CC: Neck Pain; Back Pain; and Shoulder Pain   HPI:    Colleen Mejia is a 44 y.o. year old, female patient, who returns today as an established patient. She has Chronic pain; Chronic low back pain (L>R); Anxiety; Clinical depression; Diabetes mellitus, type 2 ([place]); Fibromyalgia; Acid reflux; HLD (hyperlipidemia); BP (high blood pressure); Long term current use of opiate analgesic; Long term prescription opiate use; Opiate use; Opiate dependence ([place]); Encounter for therapeutic drug level monitoring; Encounter for chronic pain management; Chronic neck pain; Cervical spondylosis; Musculoskeletal pain; Myofascial pain; Lumbar spondylosis; Chronic left lower extremity pain; Chronic lumbar radicular pain (Bilateral) (L>R); Chronic upper back pain; GERD (gastroesophageal reflux disease); History of psychiatric care; History of psychiatric disorder; Bipolar disorder ([place]); Bronchial asthma; Generalized anxiety disorder; Substance use disorder (Very High Risk); Hypothyroidism; Lumbar facet syndrome; Chronic hip pain (Bilateral); Chronic sacroiliac joint pain (Bilateral); Chronic bilateral knee pain; 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 Her primarily concern today is the Neck Pain; Back Pain; and Shoulder Pain     Unfortunately, the patient knowingly violate it our medication agreement  by consuming an illegal substance (cocaine). Because of this, today I have informed the patient that I will never write for any more controlled substances for her. I'll was clear to her in explaining that this does not mean that we will be discharging her from the clinic, but it does mean that we will eliminate medication management as one of the options. She understood and accepted and she also did not contest the fact that in previous occasions I talked to her about this and she was well aware that she could not consume any illegal substances. Today I asked her why she had done it and she simply said that it was while low things that happens.  Today's Pain Score: 8 , clinically she looks like a 2-3/10. Reported level of pain is incompatible with clinical obrservations. This may be secondary to a possible lack of understanding on how the pain scale works. Pain Type: Chronic pain Pain Location: Neck (head, back) Pain Orientation: Lower, Left Pain Descriptors / Indicators: Constant, Burning, Aching, Sharp Pain Frequency: Constant  Date of Last Visit: 09/26/15 Service Provided on Last Visit: Med Refill  Pharmacotherapy Review:   Side-effects or Adverse reactions: None reported Effectiveness: Described as relatively effective, allowing for increase in activities of daily living (ADL) Onset of action: Within expected pharmacological parameters Duration of action: Within normal limits for medication Peak effect: Timing and results are as within normal expected parameters  PMP: Compliant with practice rules and regulations UDS Results: Unfortunately, her 09/26/2015 UDS was positive for cocaine and its metabolites. Noncompliant. UDS Interpretation: Noncompliant with practice rules and regulations Medication Assessment Form: Discrepancies found between patient's report and information collected. The patient had denied the use of illegal substances. Therefore, she lied to [place]. Treatment compliance:  Recurrent non-compliance, despite repeated warnings Substance Use Disorder (SUD) Risk Level: Very high. Pharmacologic Plan: Discontinue therapy. We will remain available for  interventional therapies but we will no longer provide any pharmacological management of her pain with controlled substances.  Lab Work: Inflammation Markers Lab Results  Component Value Date   ESRSEDRATE 12 03/16/2014    Renal Function Lab Results  Component Value Date   BUN 17 03/16/2014   CREATININE 0.99 03/16/2014   GFRAA >60 03/16/2014   GFRNONAA >60 03/16/2014    Hepatic Function Lab Results  Component Value Date   AST 23 03/16/2014   ALT 25 03/16/2014   ALBUMIN 4.2 03/16/2014    Electrolytes Lab Results  Component Value Date   NA 136 03/16/2014   K 4.0 03/16/2014   CL 105 03/16/2014   CALCIUM 10.0 [id number]    Illicit Drugs Please see 09/26/2015 UDS results. (Positive for cocaine and its metabolites)   Allergies:  Colleen Mejia has No Known Allergies.  Meds:  The patient has a current medication list which includes the following prescription(s): albuterol, esomeprazole, hydrocodone-acetaminophen, loratadine, losartan, montelukast, pregabalin, quetiapine, tizanidine, ipratropium-albuterol, and methylphenidate. Requested Prescriptions    No prescriptions requested or ordered in this encounter    ROS:  Constitutional: Afebrile, no chills, well hydrated and well nourished Gastrointestinal: negative Musculoskeletal:negative Neurological: negative Behavioral/Psych: negative  PFSH:  Medical:  Colleen Mejia  has a past medical history of Chronic back pain; Hypertension; Osteoarthritis; and Diabetes mellitus without complication ([place]). Family: family history includes Diabetes in her father; Osteoporosis in her mother. Surgical:  has no past surgical history on file. Tobacco:  reports that she has been smoking.  She does not have any smokeless tobacco history on file. Alcohol:  has no  alcohol history on file. Drug:  has no drug history on file.  Physical Exam:  Vitals:  Today's Vitals   10/25/15 0946 10/25/15 0947  BP:  129/75  Pulse: 80   Temp: 98.6 F (37 C)   Resp: 18   Height: 5\' 4"  (1.626 m)   Weight: 200 lb (90.719 kg)   SpO2: 99%   PainSc: 8  8   PainLoc: Neck   Calculated BMI: Body mass index is 34.31 kg/(m^2). General appearance: alert, appears stated age, no distress and moderately obese Eyes: PERLA Respiratory: No evidence respiratory distress, no audible rales or ronchi and no use of accessory muscles of respiration Neck: no adenopathy, no carotid bruit, no JVD, supple, symmetrical, trachea midline and thyroid not enlarged, symmetric, no tenderness/mass/nodules   Assessment:  Encounter Diagnosis:  Primary Diagnosis: Chronic pain [G89.29]  Plan:  Interventional Therapies: None at this time.  Colleen Mejia was seen today for neck pain, back pain and shoulder pain.  Diagnoses and all orders for this visit:  Chronic pain  Cocaine use (see 09/26/2015 UDS)  Encounter for chronic pain management     Patient Instructions  Smoking Cessation, Tips for Success If you are ready to quit smoking, congratulations! You have chosen to help yourself be healthier. Cigarettes bring nicotine, tar, carbon monoxide, and other irritants into your body. Your lungs, heart, and blood vessels will be able to work better without these poisons. There are many different ways to quit smoking. Nicotine gum, nicotine patches, a nicotine inhaler, or nicotine nasal spray can help with physical craving. Hypnosis, support groups, and medicines help break the habit of smoking. WHAT THINGS CAN I DO TO MAKE QUITTING EASIER?  Here are some tips to help you quit for good:  Pick a date when you will quit smoking completely. Tell all of your friends and family about your plan to quit on that  date.  Do not try to slowly cut down on the number of cigarettes you are smoking. Pick a quit  date and quit smoking completely starting on that day.  Throw away all cigarettes.   Clean and remove all ashtrays from your home, work, and car.  On a card, write down your reasons for quitting. Carry the card with you and read it when you get the urge to smoke.  Cleanse your body of nicotine. Drink enough water and fluids to keep your urine clear or pale yellow. Do this after quitting to flush the nicotine from your body.  Learn to predict your moods. Do not let a bad situation be your excuse to have a cigarette. Some situations in your life might tempt you into wanting a cigarette.  Never have "just one" cigarette. It leads to wanting another and another. Remind yourself of your decision to quit.  Change habits associated with smoking. If you smoked while driving or when feeling stressed, try other activities to replace smoking. Stand up when drinking your coffee. Brush your teeth after eating. Sit in a different chair when you read the paper. Avoid alcohol while trying to quit, and try to drink fewer caffeinated beverages. Alcohol and caffeine may urge you to smoke.  Avoid foods and drinks that can trigger a desire to smoke, such as sugary or spicy foods and alcohol.  Ask people who smoke not to smoke around you.  Have something planned to do right after eating or having a cup of coffee. For example, plan to take a walk or exercise.  Try a relaxation exercise to calm you down and decrease your stress. Remember, you may be tense and nervous for the first 2 weeks after you quit, but this will pass.  Find new activities to keep your hands busy. Play with a pen, coin, or rubber band. Doodle or draw things on paper.  Brush your teeth right after eating. This will help cut down on the craving for the taste of tobacco after meals. You can also try mouthwash.   Use oral substitutes in place of cigarettes. Try using lemon drops, carrots, cinnamon sticks, or chewing gum. Keep them handy so  they are available when you have the urge to smoke.  When you have the urge to smoke, try deep breathing.  Designate your home as a nonsmoking area.  If you are a heavy smoker, ask your health care provider about a prescription for nicotine chewing gum. It can ease your withdrawal from nicotine.  Reward yourself. Set aside the cigarette money you save and buy yourself something nice.  Look for support from others. Join a support group or smoking cessation program. Ask someone at home or at work to help you with your plan to quit smoking.  Always ask yourself, "Do I need this cigarette or is this just a reflex?" Tell yourself, "Today, I choose not to smoke," or "I do not want to smoke." You are reminding yourself of your decision to quit.  Do not replace cigarette smoking with electronic cigarettes (commonly called e-cigarettes). The safety of e-cigarettes is unknown, and some may contain harmful chemicals.  If you relapse, do not give up! Plan ahead and think about what you will do the next time you get the urge to smoke. HOW WILL I FEEL WHEN I QUIT SMOKING? You may have symptoms of withdrawal because your body is used to nicotine (the addictive substance in cigarettes). You may crave cigarettes, be irritable, feel very  hungry, cough often, get headaches, or have difficulty concentrating. The withdrawal symptoms are only temporary. They are strongest when you first quit but will go away within 10-14 days. When withdrawal symptoms occur, stay in control. Think about your reasons for quitting. Remind yourself that these are signs that your body is healing and getting used to being without cigarettes. Remember that withdrawal symptoms are easier to treat than the major diseases that smoking can cause.  Even after the withdrawal is over, expect periodic urges to smoke. However, these cravings are generally short lived and will go away whether you smoke or not. Do not smoke! WHAT RESOURCES ARE  AVAILABLE TO HELP ME QUIT SMOKING? Your health care provider can direct you to community resources or hospitals for support, which may include:  Group support.  Education.  Hypnosis.  Therapy.   This information is not intended to replace advice given to you by your health care provider. Make sure you discuss any questions you have with your health care provider.   Document Released: 07/19/2004 Document Revised: 11/11/2014 Document Reviewed: 04/08/2013 Elsevier Interactive Patient Education 2016 [employer].    Medications discontinued today:  There are no discontinued medications. Medications administered today:  Colleen Mejia does not currently have medications on file.  Primary Care Physician: Tracie Harrier, MD Location: [facility] Note by: Kathlen Brunswick. Dossie Arbour, M.D, DABA, DABAPM, DABPM, DABIPP, FIPP

## 2015-10-25 NOTE — Patient Instructions (Signed)
Smoking Cessation, Tips for Success If you are ready to quit smoking, congratulations! You have chosen to help yourself be healthier. Cigarettes bring nicotine, tar, carbon monoxide, and other irritants into your body. Your lungs, heart, and blood vessels will be able to work better without these poisons. There are many different ways to quit smoking. Nicotine gum, nicotine patches, a nicotine inhaler, or nicotine nasal spray can help with physical craving. Hypnosis, support groups, and medicines help break the habit of smoking. WHAT THINGS CAN I DO TO MAKE QUITTING EASIER?  Here are some tips to help you quit for good:  Pick a date when you will quit smoking completely. Tell all of your friends and family about your plan to quit on that date.  Do not try to slowly cut down on the number of cigarettes you are smoking. Pick a quit date and quit smoking completely starting on that day.  Throw away all cigarettes.   Clean and remove all ashtrays from your home, work, and car.  On a card, write down your reasons for quitting. Carry the card with you and read it when you get the urge to smoke.  Cleanse your body of nicotine. Drink enough water and fluids to keep your urine clear or pale yellow. Do this after quitting to flush the nicotine from your body.  Learn to predict your moods. Do not let a bad situation be your excuse to have a cigarette. Some situations in your life might tempt you into wanting a cigarette.  Never have "just one" cigarette. It leads to wanting another and another. Remind yourself of your decision to quit.  Change habits associated with smoking. If you smoked while driving or when feeling stressed, try other activities to replace smoking. Stand up when drinking your coffee. Brush your teeth after eating. Sit in a different chair when you read the paper. Avoid alcohol while trying to quit, and try to drink fewer caffeinated beverages. Alcohol and caffeine may urge you to  smoke.  Avoid foods and drinks that can trigger a desire to smoke, such as sugary or spicy foods and alcohol.  Ask people who smoke not to smoke around you.  Have something planned to do right after eating or having a cup of coffee. For example, plan to take a walk or exercise.  Try a relaxation exercise to calm you down and decrease your stress. Remember, you may be tense and nervous for the first 2 weeks after you quit, but this will pass.  Find new activities to keep your hands busy. Play with a pen, coin, or rubber band. Doodle or draw things on paper.  Brush your teeth right after eating. This will help cut down on the craving for the taste of tobacco after meals. You can also try mouthwash.   Use oral substitutes in place of cigarettes. Try using lemon drops, carrots, cinnamon sticks, or chewing gum. Keep them handy so they are available when you have the urge to smoke.  When you have the urge to smoke, try deep breathing.  Designate your home as a nonsmoking area.  If you are a heavy smoker, ask your health care provider about a prescription for nicotine chewing gum. It can ease your withdrawal from nicotine.  Reward yourself. Set aside the cigarette money you save and buy yourself something nice.  Look for support from others. Join a support group or smoking cessation program. Ask someone at home or at work to help you with your plan   to quit smoking.  Always ask yourself, "Do I need this cigarette or is this just a reflex?" Tell yourself, "Today, I choose not to smoke," or "I do not want to smoke." You are reminding yourself of your decision to quit.  Do not replace cigarette smoking with electronic cigarettes (commonly called e-cigarettes). The safety of e-cigarettes is unknown, and some may contain harmful chemicals.  If you relapse, do not give up! Plan ahead and think about what you will do the next time you get the urge to smoke. HOW WILL I FEEL WHEN I QUIT SMOKING? You  may have symptoms of withdrawal because your body is used to nicotine (the addictive substance in cigarettes). You may crave cigarettes, be irritable, feel very hungry, cough often, get headaches, or have difficulty concentrating. The withdrawal symptoms are only temporary. They are strongest when you first quit but will go away within 10-14 days. When withdrawal symptoms occur, stay in control. Think about your reasons for quitting. Remind yourself that these are signs that your body is healing and getting used to being without cigarettes. Remember that withdrawal symptoms are easier to treat than the major diseases that smoking can cause.  Even after the withdrawal is over, expect periodic urges to smoke. However, these cravings are generally short lived and will go away whether you smoke or not. Do not smoke! WHAT RESOURCES ARE AVAILABLE TO HELP ME QUIT SMOKING? Your health care provider can direct you to community resources or hospitals for support, which may include:  Group support.  Education.  Hypnosis.  Therapy.   This information is not intended to replace advice given to you by your health care provider. Make sure you discuss any questions you have with your health care provider.   Document Released: 07/19/2004 Document Revised: 11/11/2014 Document Reviewed: 04/08/2013 Elsevier Interactive Patient Education 2016 [employer]  

## 2015-10-25 NOTE — Progress Notes (Signed)
Safety precautions to be maintained throughout the outpatient stay will include: orient to surroundings, keep bed in low position, maintain call bell within reach at all times, provide assistance with transfer out of bed and ambulation. Hydorcodone pill count #0

## 2015-10-26 ENCOUNTER — Ambulatory Visit: Payer: Medicare Other | Admitting: Pain Medicine

## 2015-11-02 LAB — TOXASSURE SELECT 13 (MW), URINE: PDF: 0

## 2015-11-02 NOTE — Progress Notes (Signed)
Quick Note:  The findings of this UDT are unacceptable and incompatible with appropriate, responsible use of controlled substances. This represents non-compliance with the safe use of controlled substances. We will no longer offer this therapy as a treatment option for this patient. ______ 

## 2015-12-15 ENCOUNTER — Encounter: Payer: Self-pay | Admitting: Emergency Medicine

## 2015-12-15 ENCOUNTER — Emergency Department
Admission: EM | Admit: 2015-12-15 | Discharge: 2015-12-15 | Disposition: A | Discharge status: Home / Self Care | Payer: Medicare Other | Attending: Emergency Medicine | Admitting: Emergency Medicine

## 2015-12-15 DIAGNOSIS — E119 Type 2 diabetes mellitus without complications: Secondary | ICD-10-CM | POA: Insufficient documentation

## 2015-12-15 DIAGNOSIS — S8012XA Contusion of left lower leg, initial encounter: Secondary | ICD-10-CM | POA: Diagnosis not present

## 2015-12-15 DIAGNOSIS — Y9389 Activity, other specified: Secondary | ICD-10-CM | POA: Diagnosis not present

## 2015-12-15 DIAGNOSIS — Z79899 Other long term (current) drug therapy: Secondary | ICD-10-CM | POA: Insufficient documentation

## 2015-12-15 DIAGNOSIS — F17200 Nicotine dependence, unspecified, uncomplicated: Secondary | ICD-10-CM | POA: Insufficient documentation

## 2015-12-15 DIAGNOSIS — S199XXA Unspecified injury of neck, initial encounter: Secondary | ICD-10-CM | POA: Diagnosis not present

## 2015-12-15 DIAGNOSIS — S0990XA Unspecified injury of head, initial encounter: Secondary | ICD-10-CM | POA: Insufficient documentation

## 2015-12-15 DIAGNOSIS — Y92410 Unspecified street and highway as the place of occurrence of the external cause: Secondary | ICD-10-CM | POA: Diagnosis not present

## 2015-12-15 DIAGNOSIS — I10 Essential (primary) hypertension: Secondary | ICD-10-CM | POA: Insufficient documentation

## 2015-12-15 DIAGNOSIS — S3992XA Unspecified injury of lower back, initial encounter: Secondary | ICD-10-CM | POA: Diagnosis not present

## 2015-12-15 DIAGNOSIS — S40022A Contusion of left upper arm, initial encounter: Secondary | ICD-10-CM | POA: Insufficient documentation

## 2015-12-15 DIAGNOSIS — S4992XA Unspecified injury of left shoulder and upper arm, initial encounter: Secondary | ICD-10-CM | POA: Diagnosis present

## 2015-12-15 DIAGNOSIS — R52 Pain, unspecified: Secondary | ICD-10-CM

## 2015-12-15 DIAGNOSIS — Y998 Other external cause status: Secondary | ICD-10-CM | POA: Insufficient documentation

## 2015-12-15 MED ORDER — KETOROLAC TROMETHAMINE 60 MG/2ML IM SOLN
60.0000 mg | Freq: Once | INTRAMUSCULAR | Status: AC
  Administered 2015-12-15: 60 mg via INTRAMUSCULAR
  Filled 2015-12-15: qty 2

## 2015-12-15 MED ORDER — METHOCARBAMOL 500 MG PO TABS: 500.0000 mg | ORAL_TABLET | Freq: Four times a day (QID) | ORAL | Status: DC

## 2015-12-15 MED ORDER — DIFLUNISAL 500 MG PO TABS: 500.0000 mg | ORAL_TABLET | Freq: Two times a day (BID) | ORAL | Status: DC
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
## 2015-12-15 NOTE — Discharge Instructions (Signed)

## 2016-02-28 ENCOUNTER — Other Ambulatory Visit: Payer: Self-pay | Admitting: Internal Medicine

## 2016-02-28 DIAGNOSIS — J4530 Mild persistent asthma, uncomplicated: Secondary | ICD-10-CM | POA: Insufficient documentation

## 2016-02-28 DIAGNOSIS — R112 Nausea with vomiting, unspecified: Secondary | ICD-10-CM

## 2016-02-29 ENCOUNTER — Ambulatory Visit
Admission: RE | Admit: 2016-02-29 | Discharge: 2016-02-29 | Disposition: A | Discharge status: Home / Self Care | Payer: Medicare Other | Source: Ambulatory Visit | Attending: Internal Medicine | Admitting: Internal Medicine

## 2016-02-29 DIAGNOSIS — R140 Abdominal distension (gaseous): Secondary | ICD-10-CM | POA: Diagnosis present

## 2016-02-29 DIAGNOSIS — Z9049 Acquired absence of other specified parts of digestive tract: Secondary | ICD-10-CM | POA: Insufficient documentation

## 2016-02-29 DIAGNOSIS — R112 Nausea with vomiting, unspecified: Secondary | ICD-10-CM | POA: Diagnosis present

## 2016-03-05 DIAGNOSIS — G44229 Chronic tension-type headache, not intractable: Secondary | ICD-10-CM | POA: Insufficient documentation

## 2016-03-05 DIAGNOSIS — H8110 Benign paroxysmal vertigo, unspecified ear: Secondary | ICD-10-CM | POA: Insufficient documentation

## 2016-03-06 ENCOUNTER — Other Ambulatory Visit: Payer: Self-pay | Admitting: Internal Medicine

## 2016-03-06 DIAGNOSIS — K769 Liver disease, unspecified: Secondary | ICD-10-CM

## 2016-03-06 DIAGNOSIS — K760 Fatty (change of) liver, not elsewhere classified: Secondary | ICD-10-CM

## 2016-03-29 ENCOUNTER — Ambulatory Visit: Admission: RE | Admit: 2016-03-29 | Payer: Medicare Other | Source: Ambulatory Visit

## 2016-04-10 ENCOUNTER — Ambulatory Visit: Payer: Medicare Other | Admitting: Physical Therapy

## 2016-04-15 ENCOUNTER — Ambulatory Visit: Payer: Medicare Other | Attending: Neurology | Admitting: Physical Therapy

## 2016-04-17 ENCOUNTER — Ambulatory Visit: Payer: Medicare Other | Admitting: Physical Therapy

## 2016-04-23 ENCOUNTER — Ambulatory Visit: Admission: RE | Admit: 2016-04-23 | Payer: Medicare Other | Source: Ambulatory Visit

## 2016-04-24 ENCOUNTER — Ambulatory Visit: Payer: Medicare Other | Admitting: Physical Therapy

## 2016-04-29 ENCOUNTER — Ambulatory Visit: Payer: Medicare Other | Admitting: Physical Therapy

## 2016-05-01 ENCOUNTER — Ambulatory Visit: Payer: Medicare Other | Admitting: Physical Therapy

## 2016-09-11 DIAGNOSIS — F901 Attention-deficit hyperactivity disorder, predominantly hyperactive type: Secondary | ICD-10-CM | POA: Insufficient documentation

## 2016-09-12 ENCOUNTER — Other Ambulatory Visit: Payer: Self-pay | Admitting: Internal Medicine

## 2016-09-12 DIAGNOSIS — K769 Liver disease, unspecified: Secondary | ICD-10-CM

## 2016-11-02 ENCOUNTER — Ambulatory Visit: Payer: No Typology Code available for payment source

## 2016-11-05 ENCOUNTER — Encounter: Payer: Self-pay | Admitting: Radiology

## 2016-11-05 ENCOUNTER — Ambulatory Visit
Admission: RE | Admit: 2016-11-05 | Discharge: 2016-11-05 | Disposition: A | Discharge status: Home / Self Care | Payer: Medicare Other | Source: Ambulatory Visit | Attending: Internal Medicine | Admitting: Internal Medicine

## 2016-11-05 DIAGNOSIS — K760 Fatty (change of) liver, not elsewhere classified: Secondary | ICD-10-CM | POA: Diagnosis not present

## 2016-11-05 DIAGNOSIS — K769 Liver disease, unspecified: Secondary | ICD-10-CM | POA: Diagnosis present

## 2016-11-05 LAB — POCT I-STAT CREATININE: CREATININE: 0.7 mg/dL (ref 0.44–1.00)

## 2016-11-05 MED ORDER — GADOXETATE DISODIUM 0.25 MMOL/ML IV SOLN
10.0000 mL | Freq: Once | INTRAVENOUS | Status: AC | PRN
  Administered 2016-11-05: 9 mL via INTRAVENOUS

## 2017-07-23 ENCOUNTER — Encounter: Payer: Self-pay | Admitting: *Deleted

## 2017-07-23 ENCOUNTER — Emergency Department
Admission: EM | Admit: 2017-07-23 | Discharge: 2017-07-23 | Disposition: A | Discharge status: Home / Self Care | Payer: Medicare Other | Attending: Emergency Medicine | Admitting: Emergency Medicine

## 2017-07-23 ENCOUNTER — Emergency Department: Payer: Medicare Other

## 2017-07-23 DIAGNOSIS — E119 Type 2 diabetes mellitus without complications: Secondary | ICD-10-CM | POA: Diagnosis not present

## 2017-07-23 DIAGNOSIS — Z79899 Other long term (current) drug therapy: Secondary | ICD-10-CM | POA: Diagnosis not present

## 2017-07-23 DIAGNOSIS — I10 Essential (primary) hypertension: Secondary | ICD-10-CM | POA: Insufficient documentation

## 2017-07-23 DIAGNOSIS — G8929 Other chronic pain: Secondary | ICD-10-CM | POA: Insufficient documentation

## 2017-07-23 DIAGNOSIS — R1084 Generalized abdominal pain: Secondary | ICD-10-CM

## 2017-07-23 DIAGNOSIS — E039 Hypothyroidism, unspecified: Secondary | ICD-10-CM | POA: Insufficient documentation

## 2017-07-23 DIAGNOSIS — J45909 Unspecified asthma, uncomplicated: Secondary | ICD-10-CM | POA: Insufficient documentation

## 2017-07-23 DIAGNOSIS — F17210 Nicotine dependence, cigarettes, uncomplicated: Secondary | ICD-10-CM | POA: Diagnosis not present

## 2017-07-23 DIAGNOSIS — R109 Unspecified abdominal pain: Secondary | ICD-10-CM | POA: Diagnosis present

## 2017-07-23 HISTORY — DX: Unspecified asthma, uncomplicated: J45.909

## 2017-07-23 LAB — CBC
HCT: 40.6 % (ref 35.0–47.0)
HEMOGLOBIN: 14.2 g/dL (ref 12.0–16.0)
MCH: 29.8 pg (ref 26.0–34.0)
MCHC: 35 g/dL (ref 32.0–36.0)
MCV: 85.1 fL (ref 80.0–100.0)
Platelets: 247 10*3/uL (ref 150–440)
RBC: 4.77 MIL/uL (ref 3.80–5.20)
RDW: 13.5 % (ref 11.5–14.5)
WBC: 5.1 10*3/uL (ref 3.6–11.0)

## 2017-07-23 LAB — COMPREHENSIVE METABOLIC PANEL
ALK PHOS: 58 U/L (ref 38–126)
ALT: 21 U/L (ref 14–54)
ANION GAP: 9 (ref 5–15)
AST: 24 U/L (ref 15–41)
Albumin: 4.5 g/dL (ref 3.5–5.0)
BUN: 6 mg/dL (ref 6–20)
CALCIUM: 9.8 mg/dL (ref 8.9–10.3)
CO2: 23 mmol/L (ref 22–32)
CREATININE: 0.63 mg/dL (ref 0.44–1.00)
Chloride: 109 mmol/L (ref 101–111)
GFR calc Af Amer: >60 mL/min (ref >60)
Glucose, Bld: 118 mg/dL — ABNORMAL HIGH (ref 65–99)
Potassium: 4.4 mmol/L (ref 3.5–5.1)
Sodium: 141 mmol/L (ref 135–145)
TOTAL PROTEIN: 7.8 g/dL (ref 6.5–8.1)
Total Bilirubin: 0.5 mg/dL (ref 0.3–1.2)

## 2017-07-23 LAB — URINALYSIS, COMPLETE (UACMP) WITH MICROSCOPIC
BILIRUBIN URINE: NEGATIVE
Bacteria, UA: NONE SEEN
GLUCOSE, UA: NEGATIVE mg/dL
HGB URINE DIPSTICK: NEGATIVE
KETONES UR: NEGATIVE mg/dL
LEUKOCYTES UA: NEGATIVE
NITRITE: NEGATIVE
PH: 5 (ref 5.0–8.0)
Protein, ur: NEGATIVE mg/dL
Specific Gravity, Urine: 1.006 (ref 1.005–1.030)

## 2017-07-23 LAB — PREGNANCY, URINE: Preg Test, Ur: NEGATIVE

## 2017-07-23 LAB — LIPASE, BLOOD: Lipase: 35 U/L (ref 11–51)

## 2017-07-23 MED ORDER — KETOROLAC TROMETHAMINE 30 MG/ML IJ SOLN
15.0000 mg | Freq: Once | INTRAMUSCULAR | Status: AC
  Administered 2017-07-23: 15 mg via INTRAVENOUS
  Filled 2017-07-23: qty 1

## 2017-07-23 MED ORDER — IOPAMIDOL (ISOVUE-300) INJECTION 61%
100.0000 mL | Freq: Once | INTRAVENOUS | Status: AC | PRN
  Administered 2017-07-23: 100 mL via INTRAVENOUS
  Filled 2017-07-23: qty 100

## 2017-07-23 NOTE — Discharge Instructions (Signed)

## 2017-07-23 NOTE — ED Notes (Signed)
Called lab to add on urine preg  

## 2017-07-23 NOTE — ED Notes (Signed)
Pt using phone to call for ride.

## 2017-07-23 NOTE — ED Notes (Addendum)
Pt states abd pain "all around" x 4 days. States pain has been getting worse. Does not have gallbladder. Pt states tonsils swollen d/t stress. Denies N&V&D. Denies fever. States whole abd tender to touch. Denies burning while urinating but states "where I use the bathroom is burning right now."

## 2017-07-23 NOTE — ED Provider Notes (Signed)
[facility] Emergency Department Provider Note  ____________________________________________  Time seen: Approximately 3:31 PM  I have reviewed the triage vital signs and the nursing notes.   HISTORY  Chief Complaint Abdominal Pain   HPI Colleen Mejia is a 46 y.o. female with a history of asthma, fibromyalgia, hypertension, opiate abuse, bipolar disorder who presents for evaluation of abdominal pain.patient reports diffuse cramping constant severe abdominal pain radiating to the bilateral flanks that has been going on for 4 days. The pain has been getting worse and is currently severe. She denies fever or chills, nausea, vomiting, diarrhea, no chest pain or shortness of breath. no prior history of SBO, no abdominal distention.She says that since earlier today she's been having mild discomfort when she urinates. No constipation or diarrhea. Patient has had a tubal ligation.patient has had a cholecystectomy several years ago.  Past Medical History:  Diagnosis Date  . Asthma   . Chronic back pain   . Diabetes mellitus without complication ([place])   . Hypertension   . Osteoarthritis     Patient Active Problem List   Diagnosis Date Noted  . Cocaine use (see 09/26/2015 UDS) 10/10/2015  . Type 2 diabetes mellitus ([place]) 09/26/2015  . Chronic pain 09/23/2015  . Chronic low back pain (L>R) 09/23/2015  . Anxiety 09/23/2015  . Clinical depression 09/23/2015  . Diabetes mellitus, type 2 ([place]) 09/23/2015  . Fibromyalgia 09/23/2015  . Acid reflux 09/23/2015  . HLD (hyperlipidemia) 09/23/2015  . BP (high blood pressure) 09/23/2015  . Long term current use of opiate analgesic 09/23/2015  . Long term prescription opiate use 09/23/2015  . Opiate use 09/23/2015  . Opiate dependence ([place]) 09/23/2015  . Encounter for therapeutic drug level monitoring 09/23/2015  . Encounter for chronic pain management 09/23/2015  . Chronic neck pain 09/23/2015  . Cervical  spondylosis 09/23/2015  . Musculoskeletal pain 09/23/2015  . Myofascial pain 09/23/2015  . Lumbar spondylosis 09/23/2015  . Chronic left lower extremity pain 09/23/2015  . Chronic lumbar radicular pain (Bilateral) (L>R) 09/23/2015  . Chronic upper back pain 09/23/2015  . GERD (gastroesophageal reflux disease) 09/23/2015  . History of psychiatric care 09/23/2015  . History of psychiatric disorder 09/23/2015  . Bipolar disorder ([place]) 09/23/2015  . Bronchial asthma 09/23/2015  . Generalized anxiety disorder 09/23/2015  . Substance use disorder (Very High Risk) 09/23/2015  . Hypothyroidism 09/23/2015  . Lumbar facet syndrome 09/23/2015  . Chronic hip pain (Bilateral) 09/23/2015  . Chronic sacroiliac joint pain (Bilateral) 09/23/2015  . Chronic bilateral knee pain 09/23/2015  . 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 Chronic postoperative pain 09/23/2015  . Cervical spinal stenosis (9 mm at C4-5 and C5-6, and 8 mm at C6-7) 09/23/2015  . Cervical foraminal stenosis (right C4-5 and bilateral C5-6 and C6-7) 09/23/2015  . Chronic upper extremity pain (Bilateral) 09/23/2015  . Cervical facet syndrome (Bilateral) 09/23/2015  . Chronic cervical radicular pain (Bilateral) 09/23/2015  . Carpal tunnel syndrome (Bilateral) 09/23/2015  . 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 Neurogenic pain 09/23/2015  . Neuropathic pain 09/23/2015    History reviewed. No pertinent surgical history.  Prior to Admission medications   Medication Sig Start Date End Date Taking? Authorizing Provider  albuterol (PROAIR HFA) 108 (90 BASE) MCG/ACT inhaler Inhale into the lungs. 04/25/15 04/24/16  [provider]  diflunisal (DOLOBID) 500 MG TABS tablet Take 1 tablet (500 mg total) by mouth 2 (two) times daily. 12/15/15   Cuthriell, Charline Bills, PA-C  esomeprazole (NEXIUM) 40 MG capsule Take by mouth. 04/25/15 04/24/16  [provider]  HYDROcodone-acetaminophen (NORCO/VICODIN)  5-325 MG tablet Take 1 tablet by mouth every 6 (six) hours as needed for moderate pain. 09/26/15   Milinda Pointer, MD  ipratropium-albuterol (DUONEB) 0.5-2.5 (3) MG/3ML SOLN Inhale into the lungs. 10/03/14 09/28/15  [provider]  Loratadine 10 MG CAPS Take by mouth. 10/24/14   [provider]  losartan (COZAAR) 100 MG tablet Take 100 mg by mouth daily.    [provider]  methocarbamol (ROBAXIN) 500 MG tablet Take 1 tablet (500 mg total) by mouth 4 (four) times daily. 12/15/15   Cuthriell, Charline Bills, PA-C  methylphenidate (RITALIN) 20 MG tablet Take by mouth. 09/18/15 10/18/15  [provider]  montelukast (SINGULAIR) 10 MG tablet Take by mouth. 04/10/15 04/09/16  [provider]  pregabalin (LYRICA) 100 MG capsule Take 1 capsule (100 mg total) by mouth 3 (three) times daily. 09/26/15 09/25/16  Milinda Pointer, MD  QUEtiapine (SEROQUEL) 300 MG tablet Take 300 mg by mouth at bedtime.    [provider]  tiZANidine (ZANAFLEX) 4 MG tablet Take 1 tablet (4 mg total) by mouth every 6 (six) hours as needed for muscle spasms. 09/26/15 09/25/16  Milinda Pointer, MD    Allergies Patient has no known allergies.  Family History  Problem Relation Age of Onset  . Osteoporosis Mother   . Diabetes Father     Social History Social History  Substance Use Topics  . Smoking status: Current Some Day Smoker    Packs/day: 0.00    Types: Cigarettes  . Smokeless tobacco: Never Used  . Alcohol use No    Review of Systems  Constitutional: Negative for fever. Eyes: Negative for visual changes. ENT: Negative for sore throat. Neck: No neck pain  Cardiovascular: Negative for chest pain. Respiratory: Negative for shortness of breath. Gastrointestinal: + diffuse abdominal pain. No vomiting or diarrhea. Genitourinary: Negative for dysuria. Musculoskeletal: Negative for back pain. Skin: Negative for rash. Neurological: Negative for headaches,  weakness or numbness. Psych: No SI or HI  ____________________________________________   PHYSICAL EXAM:  VITAL SIGNS: ED Triage Vitals  Enc Vitals Group     BP 07/23/17 1511 (!) 162/86     Pulse Rate 07/23/17 1511 67     Resp 07/23/17 1511 18     Temp 07/23/17 1356 98.6 F (37 C)     Temp Source 07/23/17 1356 Oral     SpO2 07/23/17 1511 98 %     Weight --      Height 07/23/17 1352 5\' 4"  (1.626 m)     Head Circumference --      Peak Flow --      Pain Score 07/23/17 1351 8     Pain Loc --      Pain Edu? --      Excl. in [place]? --     Constitutional: Alert and oriented. Well appearing and in no apparent distress. HEENT:      Head: Normocephalic and atraumatic.         Eyes: Conjunctivae are normal. Sclera is non-icteric.       Mouth/Throat: Mucous membranes are moist.       Neck: Supple with no signs of meningismus. Cardiovascular: Regular rate and rhythm. No murmurs, gallops, or rubs. 2+ symmetrical distal pulses are present in all extremities. No JVD. Respiratory: Normal respiratory effort. Lungs are clear to auscultation bilaterally. No wheezes, crackles, or rhonchi.  Gastrointestinal: Obese, soft, diffuse  tenderness throughout, and non distended with positive bowel sounds. No rebound or guarding. Genitourinary: No CVA tenderness. Musculoskeletal: Nontender with normal range of motion in all extremities. No edema, cyanosis, or erythema of extremities. Neurologic: Normal speech and language. Face is symmetric. Moving all extremities. No gross focal neurologic deficits are appreciated. Skin: Skin is warm, dry and intact. No rash noted. Psychiatric: Mood and affect are normal. Speech and behavior are normal.  ____________________________________________   LABS (all labs ordered are listed, but only abnormal results are displayed)  Labs Reviewed  COMPREHENSIVE METABOLIC PANEL - Abnormal; Notable for the following:       Result Value   Glucose, Bld 118 (*)    All other  components within normal limits  URINALYSIS, COMPLETE (UACMP) WITH MICROSCOPIC - Abnormal; Notable for the following:    Color, Urine STRAW (*)    APPearance CLEAR (*)    Squamous Epithelial / LPF 0-5 (*)    All other components within normal limits  LIPASE, BLOOD  CBC  PREGNANCY, URINE   ____________________________________________  EKG  none  ____________________________________________  RADIOLOGY  CT ap: negative  ____________________________________________   PROCEDURES  Procedure(s) performed: None Procedures Critical Care performed:  None ____________________________________________   INITIAL IMPRESSION / ASSESSMENT AND PLAN / ED COURSE  46 y.o. female with a history of asthma, fibromyalgia, hypertension, opiate abuse, bipolar disorder who presents for evaluation of 4 days of constant and diffuse abdominal painwith no other associated symptoms. Patient is well-appearing, in no distress, has normal vital signs, her abdomen is obese, soft, with positive bowel sounds, diffuse tenderness throughout with no rebound or guarding. Differential diagnoses including colitis versus diverticulitis versus kidney stone versus exacerbation of her fibromyalgia versus UTI versus pyelonephritis.her labs show normal CBC, CMP, and lipase. UA with no evidence of infection or blood. Pregnancy test negative. We'll treat pain with Toradol.    _________________________ 3:58 PM on 07/23/2017 -----------------------------------------  patient feels markedly improved. CT with no acute findings. We'll discharge home with supportive care and close follow-up with primary care doctor.  Pertinent labs & imaging results that were available during my care of the patient were reviewed by me and considered in my medical decision making (see chart for details).    ____________________________________________   FINAL CLINICAL IMPRESSION(S) / ED DIAGNOSES  Final diagnoses:  Generalized abdominal pain       NEW MEDICATIONS STARTED DURING THIS VISIT:  New Prescriptions   No medications on file     Note:  This document was prepared using Dragon voice recognition software and may include unintentional dictation errors.    Alfred Levins, [place], MD 07/23/17 [phone]

## 2017-07-23 NOTE — ED Triage Notes (Signed)
Pt to ED reporting abd pain x 4 days. Pt reports pain is burning in nature and lower abd. No changes in BM. No nausea and vomiting. No fevers reported.

## 2017-09-08 ENCOUNTER — Other Ambulatory Visit: Payer: Self-pay | Admitting: Internal Medicine

## 2017-09-08 DIAGNOSIS — Z1231 Encounter for screening mammogram for malignant neoplasm of breast: Secondary | ICD-10-CM

## 2017-09-10 ENCOUNTER — Other Ambulatory Visit: Payer: Self-pay | Admitting: Orthopedic Surgery

## 2017-09-10 DIAGNOSIS — M542 Cervicalgia: Secondary | ICD-10-CM

## 2017-09-10 DIAGNOSIS — M25511 Pain in right shoulder: Secondary | ICD-10-CM

## 2017-09-26 ENCOUNTER — Ambulatory Visit: Admission: RE | Admit: 2017-09-26 | Payer: Medicare Other | Source: Ambulatory Visit

## 2017-09-26 ENCOUNTER — Ambulatory Visit: Payer: Medicare Other

## 2017-09-28 ENCOUNTER — Emergency Department
Admission: EM | Admit: 2017-09-28 | Discharge: 2017-09-28 | Disposition: A | Discharge status: Home / Self Care | Payer: Medicare Other | Attending: Emergency Medicine | Admitting: Emergency Medicine

## 2017-09-28 ENCOUNTER — Encounter: Payer: Self-pay | Admitting: Emergency Medicine

## 2017-09-28 ENCOUNTER — Other Ambulatory Visit: Payer: Self-pay

## 2017-09-28 ENCOUNTER — Emergency Department: Payer: Medicare Other

## 2017-09-28 DIAGNOSIS — F17210 Nicotine dependence, cigarettes, uncomplicated: Secondary | ICD-10-CM | POA: Insufficient documentation

## 2017-09-28 DIAGNOSIS — Y92009 Unspecified place in unspecified non-institutional (private) residence as the place of occurrence of the external cause: Secondary | ICD-10-CM | POA: Diagnosis not present

## 2017-09-28 DIAGNOSIS — W25XXXA Contact with sharp glass, initial encounter: Secondary | ICD-10-CM | POA: Diagnosis not present

## 2017-09-28 DIAGNOSIS — Z23 Encounter for immunization: Secondary | ICD-10-CM | POA: Insufficient documentation

## 2017-09-28 DIAGNOSIS — Y999 Unspecified external cause status: Secondary | ICD-10-CM | POA: Diagnosis not present

## 2017-09-28 DIAGNOSIS — J45909 Unspecified asthma, uncomplicated: Secondary | ICD-10-CM | POA: Diagnosis not present

## 2017-09-28 DIAGNOSIS — E039 Hypothyroidism, unspecified: Secondary | ICD-10-CM | POA: Diagnosis not present

## 2017-09-28 DIAGNOSIS — Y93E9 Activity, other interior property and clothing maintenance: Secondary | ICD-10-CM | POA: Diagnosis not present

## 2017-09-28 DIAGNOSIS — I10 Essential (primary) hypertension: Secondary | ICD-10-CM | POA: Diagnosis not present

## 2017-09-28 DIAGNOSIS — E119 Type 2 diabetes mellitus without complications: Secondary | ICD-10-CM | POA: Insufficient documentation

## 2017-09-28 DIAGNOSIS — S91312A Laceration without foreign body, left foot, initial encounter: Secondary | ICD-10-CM

## 2017-09-28 MED ORDER — IBUPROFEN 600 MG PO TABS
600.0000 mg | ORAL_TABLET | Freq: Once | ORAL | Status: AC
  Administered 2017-09-28: 600 mg via ORAL
  Filled 2017-09-28: qty 1

## 2017-09-28 MED ORDER — TETANUS-DIPHTH-ACELL PERTUSSIS 5-2.5-18.5 LF-MCG/0.5 IM SUSP
0.5000 mL | Freq: Once | INTRAMUSCULAR | Status: AC
  Administered 2017-09-28: 0.5 mL via INTRAMUSCULAR
  Filled 2017-09-28: qty 0.5

## 2017-09-28 MED ORDER — LIDOCAINE-EPINEPHRINE 2 %-1:100000 IJ SOLN
30.0000 mL | Freq: Once | INTRAMUSCULAR | Status: AC
Start: 2017-09-28 — End: 2017-09-28
  Administered 2017-09-28: 30 mL via INTRADERMAL
  Filled 2017-09-28: qty 30

## 2017-09-28 MED ORDER — HYDROCODONE-ACETAMINOPHEN 5-325 MG PO TABS
1.0000 | ORAL_TABLET | Freq: Once | ORAL | Status: AC
  Administered 2017-09-28: 1 via ORAL
  Filled 2017-09-28: qty 1

## 2017-09-28 NOTE — ED Notes (Addendum)
CMS intact to left toes, pt denies pain  Pt reports unknown last tetanus shot

## 2017-09-28 NOTE — ED Provider Notes (Signed)
[facility] Emergency Department Provider Note ____________________________________________   I have reviewed the triage vital signs and the triage nursing note.  HISTORY  Chief Complaint Extremity Laceration and Fall   Historian Patient  HPI Colleen Mejia is a 46 y.o. female presenting with laceration to the left plantar surface of the foot when she is stepped on a broken vase overnight while decorating for the Christmas holiday.  Moderate pain.  Does not perceive for retained foreign body.  Movement makes it worse.   Past Medical History:  Diagnosis Date  . Asthma   . Chronic back pain   . Diabetes mellitus without complication ([place])   . Hypertension   . Osteoarthritis     Patient Active Problem List   Diagnosis Date Noted  . Cocaine use (see 09/26/2015 UDS) 10/10/2015  . Type 2 diabetes mellitus ([place]) 09/26/2015  . Chronic pain 09/23/2015  . Chronic low back pain (L>R) 09/23/2015  . Anxiety 09/23/2015  . Clinical depression 09/23/2015  . Diabetes mellitus, type 2 ([place]) 09/23/2015  . Fibromyalgia 09/23/2015  . Acid reflux 09/23/2015  . HLD (hyperlipidemia) 09/23/2015  . BP (high blood pressure) 09/23/2015  . Long term current use of opiate analgesic 09/23/2015  . Long term prescription opiate use 09/23/2015  . Opiate use 09/23/2015  . Opiate dependence ([place]) 09/23/2015  . Encounter for therapeutic drug level monitoring 09/23/2015  . Encounter for chronic pain management 09/23/2015  . Chronic neck pain 09/23/2015  . Cervical spondylosis 09/23/2015  . Musculoskeletal pain 09/23/2015  . Myofascial pain 09/23/2015  . Lumbar spondylosis 09/23/2015  . Chronic left lower extremity pain 09/23/2015  . Chronic lumbar radicular pain (Bilateral) (L>R) 09/23/2015  . Chronic upper back pain 09/23/2015  . GERD (gastroesophageal reflux disease) 09/23/2015  . History of psychiatric care 09/23/2015  . History of psychiatric disorder 09/23/2015  .  Bipolar disorder ([place]) 09/23/2015  . Bronchial asthma 09/23/2015  . Generalized anxiety disorder 09/23/2015  . Substance use disorder (Very High Risk) 09/23/2015  . Hypothyroidism 09/23/2015  . Lumbar facet syndrome 09/23/2015  . Chronic hip pain (Bilateral) 09/23/2015  . Chronic sacroiliac joint pain (Bilateral) 09/23/2015  . Chronic bilateral knee pain 09/23/2015  . 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 Chronic postoperative pain 09/23/2015  . Cervical spinal stenosis (9 mm at C4-5 and C5-6, and 8 mm at C6-7) 09/23/2015  . Cervical foraminal stenosis (right C4-5 and bilateral C5-6 and C6-7) 09/23/2015  . Chronic upper extremity pain (Bilateral) 09/23/2015  . Cervical facet syndrome (Bilateral) 09/23/2015  . Chronic cervical radicular pain (Bilateral) 09/23/2015  . Carpal tunnel syndrome (Bilateral) 09/23/2015  . 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 Neurogenic pain 09/23/2015  . Neuropathic pain 09/23/2015    History reviewed. No pertinent surgical history.  Prior to Admission medications   Medication Sig Start Date End Date Taking? Authorizing Provider  albuterol (PROAIR HFA) 108 (90 BASE) MCG/ACT inhaler Inhale into the lungs. 04/25/15 04/24/16  [provider]  diflunisal (DOLOBID) 500 MG TABS tablet Take 1 tablet (500 mg total) by mouth 2 (two) times daily. 12/15/15   Cuthriell, Charline Bills, PA-C  esomeprazole (NEXIUM) 40 MG capsule Take by mouth. 04/25/15 04/24/16  [provider]  HYDROcodone-acetaminophen (NORCO/VICODIN) 5-325 MG tablet Take 1 tablet by mouth every 6 (six) hours as needed for moderate pain. 09/26/15   Milinda Pointer, MD  ipratropium-albuterol (DUONEB) 0.5-2.5 (3) MG/3ML SOLN Inhale into the lungs. 10/03/14 09/28/15  [provider]  Loratadine  10 MG CAPS Take by mouth. 10/24/14   [provider]  losartan (COZAAR) 100 MG tablet Take 100 mg by mouth daily.    [provider]   methocarbamol (ROBAXIN) 500 MG tablet Take 1 tablet (500 mg total) by mouth 4 (four) times daily. 12/15/15   Cuthriell, Charline Bills, PA-C  methylphenidate (RITALIN) 20 MG tablet Take by mouth. 09/18/15 10/18/15  [provider]  montelukast (SINGULAIR) 10 MG tablet Take by mouth. 04/10/15 04/09/16  [provider]  pregabalin (LYRICA) 100 MG capsule Take 1 capsule (100 mg total) by mouth 3 (three) times daily. 09/26/15 09/25/16  Milinda Pointer, MD  QUEtiapine (SEROQUEL) 300 MG tablet Take 300 mg by mouth at bedtime.    [provider]  tiZANidine (ZANAFLEX) 4 MG tablet Take 1 tablet (4 mg total) by mouth every 6 (six) hours as needed for muscle spasms. 09/26/15 09/25/16  Milinda Pointer, MD    No Known Allergies  Family History  Problem Relation Age of Onset  . Osteoporosis Mother   . Diabetes Father     Social History Social History   Tobacco Use  . Smoking status: Current Some Day Smoker    Packs/day: 0.25    Types: Cigarettes  . Smokeless tobacco: Never Used  Substance Use Topics  . Alcohol use: No  . Drug use: No    Review of Systems  Constitutional: Negative for illness. Eyes: Negative for any eye injury ENT: Negative for any facial injury Cardiovascular: Negative for chest pain. Respiratory: Negative for shortness of breath. Gastrointestinal: Negative for abdominal pain. Genitourinary: Musculoskeletal: Negative for back injury. Skin: Negative for rash. Neurological: Negative for headache.  ____________________________________________   PHYSICAL EXAM:  VITAL SIGNS: ED Triage Vitals  Enc Vitals Group     BP 09/28/17 0602 132/75     Pulse Rate 09/28/17 0602 89     Resp 09/28/17 0602 18     Temp 09/28/17 0602 98.5 F (36.9 C)     Temp Source 09/28/17 0602 Oral     SpO2 09/28/17 0602 95 %     Weight 09/28/17 0602 181 lb (82.1 kg)     Height 09/28/17 0602 5\' 4"  (1.626 m)     Head Circumference --      Peak Flow --      Pain  Score 09/28/17 0601 0     Pain Loc --      Pain Edu? --      Excl. in [place]? --      Constitutional: Alert and oriented. Well appearing and in no distress. HEENT   Head: Normocephalic and atraumatic.      Eyes: Conjunctivae are normal. Pupils equal and round.       Ears:         Nose: No congestion/rhinnorhea.   Mouth/Throat: Mucous membranes are moist.   Neck: No stridor. Cardiovascular/Chest: Normal rate, regular rhythm.  No murmurs, rubs, or gallops. Respiratory: Normal respiratory effort without tachypnea nor retractions. Breath sounds are clear and equal bilaterally. No wheezes/rales/rhonchi. Gastrointestinal: Soft. No distention, no guarding, no rebound. Nontender.    Genitourinary/rectal:Deferred Musculoskeletal: Nontender with normal range of motion in all extremities. No joint effusions.   Laceration left sole of the foot, approximately 6 cm, oozing blood. Neurologic:  Normal speech and language. No gross or focal neurologic deficits are appreciated. Skin:  Skin is warm, dry and intact. No rash noted. Psychiatric: Mood and affect are normal. Speech and behavior are normal. Patient exhibits appropriate insight and  judgment.   ____________________________________________  LABS (pertinent positives/negatives) I, Lisa Roca, MD the attending physician have reviewed the labs noted below.  Labs Reviewed - No data to display  ____________________________________________    EKG I, Lisa Roca, MD, the attending physician have personally viewed and interpreted all ECGs.  None ____________________________________________  RADIOLOGY All Xrays were viewed by me.  Imaging interpreted by Radiologist, and I, Lisa Roca, MD the attending physician have reviewed the radiologist interpretation noted below.  Left Foot:  FINDINGS: There is no evidence of fracture or dislocation. There is no evidence of arthropathy or other focal bone abnormality. There is bandaging  material of the plantar aspect of the foot. No radiopaque foreign body.  IMPRESSION: No radiopaque foreign body within the left foot. __________________________________________  PROCEDURES  Procedure(s) performed: LACERATION REPAIR Performed by: Lisa Roca Authorized by: Lisa Roca Consent: Verbal consent obtained. Risks and benefits: risks, benefits and alternatives were discussed Consent given by: patient Patient identity confirmed: provided demographic data Prepped and Draped in normal sterile fashion Wound explored  Laceration Location: left sole of foot  Laceration Length: 6 cm  No Foreign Bodies seen or palpated  Anesthesia: local infiltration  Local anesthetic: lidocaine 2% with epinephrine  Anesthetic total: 1 ml  Irrigation method: syringe Amount of cleaning: standard  Skin closure: 4-0 ethilon  Number of sutures: 5  Technique: interrupted  Patient tolerance: Patient tolerated the procedure well with no immediate complications.   Critical Care performed: None   ____________________________________________  ED COURSE / ASSESSMENT AND PLAN  Pertinent labs & imaging results that were available during my care of the patient were reviewed by me and considered in my medical decision making (see chart for details).   No other injuries other than the laceration to the sole of the left foot.  Laceration closed with sutures.  Hemostatic with acewrap and given post op shoe.   Tetanus shot given.   CONSULTATIONS:   None   Patient / Family / Caregiver informed of clinical course, medical decision-making process, and agree with plan.   I discussed return precautions, follow-up instructions, and discharge instructions with patient and/or family.  Discharge Instructions :  Stitches need to be evaluate for removal in 10-14 days by your primary care doctor.  Return to the ER for any worsening or uncontrolled pain, increased or worsening swelling, any  pus drainage, fever, or any other symptoms concerning to you.  You may keep the area elevated as much as you can to aid with swelling over the next several days.    ___________________________________________   FINAL CLINICAL IMPRESSION(S) / ED DIAGNOSES   Final diagnoses:  Laceration of left foot, initial encounter      ___________________________________________  ED Discharge Orders    None            Note: This dictation was prepared with Dragon dictation. Any transcriptional errors that result from this process are unintentional    Lisa Roca, MD 09/28/17 [phone]

## 2017-09-28 NOTE — ED Notes (Signed)
Md at bedside

## 2017-09-28 NOTE — Discharge Instructions (Signed)
Stitches need to be evaluate for removal in 10-14 days by your primary care doctor.  Return to the ER for any worsening or uncontrolled pain, increased or worsening swelling, any pus drainage, fever, or any other symptoms concerning to you.  You may keep the area elevated as much as you can to aid with swelling over the next several days.

## 2017-09-28 NOTE — ED Triage Notes (Signed)
Patient presents to Emergency Department via [facility] with complaints of laceration to bottom of left foot, approx 5cm in length, bleeding on arrival.  Per pt: I stepped on some vase I dropped on the floor.

## 2017-09-28 NOTE — ED Notes (Signed)
Pts ride on the way

## 2017-10-01 ENCOUNTER — Ambulatory Visit
Admission: RE | Admit: 2017-10-01 | Discharge: 2017-10-01 | Disposition: A | Discharge status: Home / Self Care | Payer: Medicare Other | Source: Ambulatory Visit | Attending: Internal Medicine | Admitting: Internal Medicine

## 2017-10-01 DIAGNOSIS — Z1231 Encounter for screening mammogram for malignant neoplasm of breast: Secondary | ICD-10-CM | POA: Diagnosis present

## 2017-10-03 ENCOUNTER — Ambulatory Visit: Payer: Medicare Other

## 2017-10-03 ENCOUNTER — Other Ambulatory Visit: Payer: Self-pay | Admitting: *Deleted

## 2017-10-03 ENCOUNTER — Inpatient Hospital Stay
Admission: RE | Admit: 2017-10-03 | Discharge: 2017-10-03 | Disposition: A | Discharge status: Home / Self Care | Payer: Self-pay | Source: Ambulatory Visit | Attending: *Deleted | Admitting: *Deleted

## 2017-10-03 DIAGNOSIS — Z9289 Personal history of other medical treatment: Secondary | ICD-10-CM

## 2017-10-09 ENCOUNTER — Ambulatory Visit: Payer: Medicare Other

## 2017-10-16 ENCOUNTER — Ambulatory Visit
Admission: RE | Admit: 2017-10-16 | Discharge: 2017-10-16 | Disposition: A | Discharge status: Home / Self Care | Payer: Medicare Other | Source: Ambulatory Visit | Attending: Orthopedic Surgery | Admitting: Orthopedic Surgery

## 2017-10-16 DIAGNOSIS — M50222 Other cervical disc displacement at C5-C6 level: Secondary | ICD-10-CM | POA: Insufficient documentation

## 2017-10-16 DIAGNOSIS — M4802 Spinal stenosis, cervical region: Secondary | ICD-10-CM | POA: Diagnosis not present

## 2017-10-16 DIAGNOSIS — Z981 Arthrodesis status: Secondary | ICD-10-CM | POA: Insufficient documentation

## 2017-10-16 DIAGNOSIS — M25411 Effusion, right shoulder: Secondary | ICD-10-CM | POA: Diagnosis not present

## 2017-10-16 DIAGNOSIS — M25511 Pain in right shoulder: Secondary | ICD-10-CM | POA: Diagnosis present

## 2017-10-16 DIAGNOSIS — M75121 Complete rotator cuff tear or rupture of right shoulder, not specified as traumatic: Secondary | ICD-10-CM | POA: Insufficient documentation

## 2017-10-16 DIAGNOSIS — M542 Cervicalgia: Secondary | ICD-10-CM | POA: Diagnosis present

## 2017-11-13 ENCOUNTER — Encounter
Admission: RE | Admit: 2017-11-13 | Discharge: 2017-11-13 | Disposition: A | Discharge status: Home / Self Care | Payer: Medicare Other | Source: Ambulatory Visit | Attending: Orthopedic Surgery | Admitting: Orthopedic Surgery

## 2017-11-13 ENCOUNTER — Other Ambulatory Visit: Payer: Self-pay

## 2017-11-13 HISTORY — DX: Anemia, unspecified: D64.9

## 2017-11-13 HISTORY — DX: Gastro-esophageal reflux disease without esophagitis: K21.9

## 2017-11-13 NOTE — Patient Instructions (Addendum)
Your procedure is scheduled on: 11-21-17 FRIDAY Report to Same Day Surgery 2nd floor medical mall [facility] Entrance-take elevator on left to 2nd floor.  Check in with surgery information desk.) To find out your arrival time please call [phone] between 1PM - 3PM on 11-20-17 THURSDAY  Remember: Instructions that are not followed completely may result in serious medical risk, up to and including death, or upon the discretion of your surgeon and anesthesiologist your surgery may need to be rescheduled.    _x___ 1. Do not eat food after midnight the night before your procedure. NO GUM OR CANDY AFTER MIDNIGHT.  You may drink clear liquids up to 2 hours before you are scheduled to arrive at the hospital for your procedure.  Do not drink clear liquids within 2 hours of your scheduled arrival to the hospital.  Clear liquids include  --Water or Apple juice without pulp  --Clear carbohydrate beverage such as ClearFast or Gatorade  --Black Coffee or Clear Tea (No milk, no creamers, do not add anything to the coffee or Tea     __x__ 2. No Alcohol for 24 hours before or after surgery.   __x__3. No Smoking for 24 prior to surgery.   ____  4. Bring all medications with you on the day of surgery if instructed.    __x__ 5. Notify your doctor if there is any change in your medical condition     (cold, fever, infections).     Do not wear jewelry, make-up, hairpins, clips or nail polish.  Do not wear lotions, powders, or perfumes. You may wear deodorant.  Do not shave 48 hours prior to surgery. Men may shave face and neck.  Do not bring valuables to the hospital.    [facility] is not responsible for any belongings or valuables.               Contacts, dentures or bridgework may not be worn into surgery.  Leave your suitcase in the car. After surgery it may be brought to your room.  For patients admitted to the hospital, discharge time is determined by your treatment team.   Patients  discharged the day of surgery will not be allowed to drive home.  You will need someone to drive you home and stay with you the night of your procedure.    Please read over the following fact sheets that you were given:   [facility] Preparing for Surgery and or MRSA Information   _x___ TAKE THE FOLLOWING MEDICATION THE MORNING OF SURGERY WITH A SMALL SIP OF WATER. These include:  1. CLARITIN  2. LYRICA  3. [place]  4. TAKE AN EXTRA NEXIUM ON Thursday NIGHT BEFORE BED (11-20-17)  5.  6.  ____Fleets enema or Magnesium Citrate as directed.   _x___ Use CHG Soap or sage wipes as directed on instruction sheet   _X___ Use inhalers on the day of surgery and bring to hospital day of surgery-USE YOUR NEBULIZER AT HOME AM OF SURGERY AND BRING ALBUTEROL [place]  ____ Stop Metformin and Janumet 2 days prior to surgery.    ____ Take 1/2 of usual insulin dose the night before surgery and none on the morning surgery.   ____ Follow recommendations from Cardiologist, Pulmonologist or PCP regarding stopping Aspirin, Coumadin, Plavix ,Eliquis, Effient, or Pradaxa, and Pletal.  X____Stop Anti-inflammatories such as Advil, Aleve, Ibuprofen, Motrin, Naproxen, Naprosyn, Goodies powders or aspirin products NOW-OK to take Tylenol    ____ Stop  supplements until after surgery.   ____ Bring C-Pap to the hospital.

## 2017-11-18 ENCOUNTER — Encounter
Admission: RE | Admit: 2017-11-18 | Discharge: 2017-11-18 | Disposition: A | Discharge status: Home / Self Care | Payer: Medicare Other | Source: Ambulatory Visit | Attending: Orthopedic Surgery | Admitting: Orthopedic Surgery

## 2017-11-18 DIAGNOSIS — I38 Endocarditis, valve unspecified: Secondary | ICD-10-CM | POA: Diagnosis not present

## 2017-11-18 DIAGNOSIS — E785 Hyperlipidemia, unspecified: Secondary | ICD-10-CM | POA: Diagnosis not present

## 2017-11-18 DIAGNOSIS — Z8249 Family history of ischemic heart disease and other diseases of the circulatory system: Secondary | ICD-10-CM | POA: Diagnosis not present

## 2017-11-18 DIAGNOSIS — Z9049 Acquired absence of other specified parts of digestive tract: Secondary | ICD-10-CM | POA: Diagnosis not present

## 2017-11-18 DIAGNOSIS — E119 Type 2 diabetes mellitus without complications: Secondary | ICD-10-CM | POA: Diagnosis not present

## 2017-11-18 DIAGNOSIS — Z833 Family history of diabetes mellitus: Secondary | ICD-10-CM | POA: Diagnosis not present

## 2017-11-18 DIAGNOSIS — R011 Cardiac murmur, unspecified: Secondary | ICD-10-CM | POA: Diagnosis not present

## 2017-11-18 DIAGNOSIS — K219 Gastro-esophageal reflux disease without esophagitis: Secondary | ICD-10-CM | POA: Diagnosis not present

## 2017-11-18 DIAGNOSIS — F909 Attention-deficit hyperactivity disorder, unspecified type: Secondary | ICD-10-CM | POA: Diagnosis not present

## 2017-11-18 DIAGNOSIS — Z6832 Body mass index (BMI) 32.0-32.9, adult: Secondary | ICD-10-CM | POA: Diagnosis not present

## 2017-11-18 DIAGNOSIS — M1990 Unspecified osteoarthritis, unspecified site: Secondary | ICD-10-CM | POA: Diagnosis not present

## 2017-11-18 DIAGNOSIS — E039 Hypothyroidism, unspecified: Secondary | ICD-10-CM | POA: Diagnosis not present

## 2017-11-18 DIAGNOSIS — F419 Anxiety disorder, unspecified: Secondary | ICD-10-CM | POA: Diagnosis not present

## 2017-11-18 DIAGNOSIS — M65811 Other synovitis and tenosynovitis, right shoulder: Secondary | ICD-10-CM | POA: Diagnosis not present

## 2017-11-18 DIAGNOSIS — J309 Allergic rhinitis, unspecified: Secondary | ICD-10-CM | POA: Diagnosis not present

## 2017-11-18 DIAGNOSIS — F17210 Nicotine dependence, cigarettes, uncomplicated: Secondary | ICD-10-CM | POA: Diagnosis not present

## 2017-11-18 DIAGNOSIS — E669 Obesity, unspecified: Secondary | ICD-10-CM | POA: Diagnosis not present

## 2017-11-18 DIAGNOSIS — I10 Essential (primary) hypertension: Secondary | ICD-10-CM | POA: Diagnosis not present

## 2017-11-18 DIAGNOSIS — J4520 Mild intermittent asthma, uncomplicated: Secondary | ICD-10-CM | POA: Diagnosis not present

## 2017-11-18 DIAGNOSIS — M797 Fibromyalgia: Secondary | ICD-10-CM | POA: Diagnosis not present

## 2017-11-18 DIAGNOSIS — S46011A Strain of muscle(s) and tendon(s) of the rotator cuff of right shoulder, initial encounter: Secondary | ICD-10-CM | POA: Diagnosis not present

## 2017-11-18 DIAGNOSIS — M75101 Unspecified rotator cuff tear or rupture of right shoulder, not specified as traumatic: Secondary | ICD-10-CM | POA: Diagnosis present

## 2017-11-18 DIAGNOSIS — F319 Bipolar disorder, unspecified: Secondary | ICD-10-CM | POA: Diagnosis not present

## 2017-11-18 DIAGNOSIS — M7541 Impingement syndrome of right shoulder: Secondary | ICD-10-CM | POA: Diagnosis not present

## 2017-11-18 DIAGNOSIS — Z79899 Other long term (current) drug therapy: Secondary | ICD-10-CM | POA: Diagnosis not present

## 2017-11-19 ENCOUNTER — Encounter: Payer: Self-pay | Admitting: *Deleted
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
## 2017-11-20 ENCOUNTER — Encounter: Payer: Self-pay | Admitting: *Deleted

## 2017-11-21 ENCOUNTER — Ambulatory Visit: Payer: Medicare Other | Admitting: Anesthesiology

## 2017-11-21 ENCOUNTER — Encounter
Admission: RE | Disposition: A | Discharge status: Home / Self Care | Payer: Self-pay | Source: Ambulatory Visit | Attending: Orthopedic Surgery

## 2017-11-21 ENCOUNTER — Ambulatory Visit
Admission: RE | Admit: 2017-11-21 | Discharge: 2017-11-21 | Disposition: A | Discharge status: Home / Self Care | Payer: Medicare Other | Source: Ambulatory Visit | Attending: Orthopedic Surgery | Admitting: Orthopedic Surgery

## 2017-11-21 DIAGNOSIS — F419 Anxiety disorder, unspecified: Secondary | ICD-10-CM | POA: Insufficient documentation

## 2017-11-21 DIAGNOSIS — M65811 Other synovitis and tenosynovitis, right shoulder: Secondary | ICD-10-CM | POA: Insufficient documentation

## 2017-11-21 DIAGNOSIS — E785 Hyperlipidemia, unspecified: Secondary | ICD-10-CM | POA: Insufficient documentation

## 2017-11-21 DIAGNOSIS — J4520 Mild intermittent asthma, uncomplicated: Secondary | ICD-10-CM | POA: Insufficient documentation

## 2017-11-21 DIAGNOSIS — E669 Obesity, unspecified: Secondary | ICD-10-CM | POA: Insufficient documentation

## 2017-11-21 DIAGNOSIS — J309 Allergic rhinitis, unspecified: Secondary | ICD-10-CM | POA: Insufficient documentation

## 2017-11-21 DIAGNOSIS — Z79899 Other long term (current) drug therapy: Secondary | ICD-10-CM | POA: Insufficient documentation

## 2017-11-21 DIAGNOSIS — Z9049 Acquired absence of other specified parts of digestive tract: Secondary | ICD-10-CM | POA: Insufficient documentation

## 2017-11-21 DIAGNOSIS — I10 Essential (primary) hypertension: Secondary | ICD-10-CM | POA: Insufficient documentation

## 2017-11-21 DIAGNOSIS — E119 Type 2 diabetes mellitus without complications: Secondary | ICD-10-CM | POA: Insufficient documentation

## 2017-11-21 DIAGNOSIS — M7541 Impingement syndrome of right shoulder: Secondary | ICD-10-CM | POA: Insufficient documentation

## 2017-11-21 DIAGNOSIS — M797 Fibromyalgia: Secondary | ICD-10-CM | POA: Insufficient documentation

## 2017-11-21 DIAGNOSIS — F909 Attention-deficit hyperactivity disorder, unspecified type: Secondary | ICD-10-CM | POA: Insufficient documentation

## 2017-11-21 DIAGNOSIS — F319 Bipolar disorder, unspecified: Secondary | ICD-10-CM | POA: Insufficient documentation

## 2017-11-21 DIAGNOSIS — M549 Dorsalgia, unspecified: Secondary | ICD-10-CM | POA: Insufficient documentation

## 2017-11-21 DIAGNOSIS — Z833 Family history of diabetes mellitus: Secondary | ICD-10-CM | POA: Insufficient documentation

## 2017-11-21 DIAGNOSIS — E039 Hypothyroidism, unspecified: Secondary | ICD-10-CM | POA: Insufficient documentation

## 2017-11-21 DIAGNOSIS — Z8249 Family history of ischemic heart disease and other diseases of the circulatory system: Secondary | ICD-10-CM | POA: Insufficient documentation

## 2017-11-21 DIAGNOSIS — S46011A Strain of muscle(s) and tendon(s) of the rotator cuff of right shoulder, initial encounter: Secondary | ICD-10-CM | POA: Insufficient documentation

## 2017-11-21 DIAGNOSIS — K219 Gastro-esophageal reflux disease without esophagitis: Secondary | ICD-10-CM | POA: Insufficient documentation

## 2017-11-21 DIAGNOSIS — M1990 Unspecified osteoarthritis, unspecified site: Secondary | ICD-10-CM | POA: Insufficient documentation

## 2017-11-21 DIAGNOSIS — F17210 Nicotine dependence, cigarettes, uncomplicated: Secondary | ICD-10-CM | POA: Insufficient documentation

## 2017-11-21 DIAGNOSIS — R011 Cardiac murmur, unspecified: Secondary | ICD-10-CM | POA: Insufficient documentation

## 2017-11-21 DIAGNOSIS — Z6832 Body mass index (BMI) 32.0-32.9, adult: Secondary | ICD-10-CM | POA: Insufficient documentation

## 2017-11-21 DIAGNOSIS — G8929 Other chronic pain: Secondary | ICD-10-CM | POA: Insufficient documentation

## 2017-11-21 DIAGNOSIS — I38 Endocarditis, valve unspecified: Secondary | ICD-10-CM | POA: Insufficient documentation

## 2017-11-21 HISTORY — DX: Anesthesia of skin: R20.0

## 2017-11-21 HISTORY — DX: Adverse effect of unspecified anesthetic, initial encounter: T41.45XA

## 2017-11-21 HISTORY — DX: Other complications of anesthesia, initial encounter: T88.59XA

## 2017-11-21 HISTORY — PX: SHOULDER ARTHROSCOPY WITH OPEN ROTATOR CUFF REPAIR: SHX6092

## 2017-11-21 LAB — GLUCOSE, CAPILLARY
GLUCOSE-CAPILLARY: 135 mg/dL — AB (ref 65–99)
Glucose-Capillary: 179 mg/dL — ABNORMAL HIGH (ref 65–99)

## 2017-11-21 LAB — POCT PREGNANCY, URINE: Preg Test, Ur: NEGATIVE

## 2017-11-21 SURGERY — ARTHROSCOPY, SHOULDER WITH REPAIR, ROTATOR CUFF, OPEN
Anesthesia: General | Laterality: Right | Wound class: Clean

## 2017-11-21 MED ORDER — ONDANSETRON 4 MG PO TBDP: 4.0000 mg | ORAL_TABLET | Freq: Three times a day (TID) | ORAL | 0 refills | Status: DC | PRN

## 2017-11-21 MED ORDER — BUPIVACAINE LIPOSOME 1.3 % IJ SUSP
INTRAMUSCULAR | Status: DC | PRN
  Administered 2017-11-21: 20 mL via PERINEURAL

## 2017-11-21 MED ORDER — LIDOCAINE-EPINEPHRINE 1 %-1:100000 IJ SOLN
INTRAMUSCULAR | Status: AC
  Filled 2017-11-21: qty 1

## 2017-11-21 MED ORDER — EPINEPHRINE 30 MG/30ML IJ SOLN
INTRAMUSCULAR | Status: AC
  Filled 2017-11-21: qty 1

## 2017-11-21 MED ORDER — ONDANSETRON HCL 4 MG/2ML IJ SOLN
INTRAMUSCULAR | Status: DC | PRN
  Administered 2017-11-21: 4 mg via INTRAVENOUS

## 2017-11-21 MED ORDER — FENTANYL CITRATE (PF) 100 MCG/2ML IJ SOLN
INTRAMUSCULAR | Status: AC
  Administered 2017-11-21: 50 ug via INTRAVENOUS
  Filled 2017-11-21: qty 2

## 2017-11-21 MED ORDER — MIDAZOLAM HCL 2 MG/2ML IJ SOLN
1.0000 mg | Freq: Once | INTRAMUSCULAR | Status: AC
  Administered 2017-11-21: 1 mg via INTRAVENOUS

## 2017-11-21 MED ORDER — METHOCARBAMOL 500 MG PO TABS
ORAL_TABLET | ORAL | Status: AC
  Filled 2017-11-21: qty 1

## 2017-11-21 MED ORDER — ACETAMINOPHEN 500 MG PO TABS: 1000.0000 mg | ORAL_TABLET | Freq: Three times a day (TID) | ORAL | 2 refills | Status: AC

## 2017-11-21 MED ORDER — ROCURONIUM BROMIDE 50 MG/5ML IV SOLN
INTRAVENOUS | Status: AC
  Filled 2017-11-21: qty 1

## 2017-11-21 MED ORDER — SODIUM CHLORIDE 0.9 % IV SOLN
INTRAVENOUS | Status: DC | PRN
  Administered 2017-11-21 (×2): via INTRAVENOUS

## 2017-11-21 MED ORDER — LIDOCAINE HCL (PF) 2 % IJ SOLN
INTRAMUSCULAR | Status: AC
  Filled 2017-11-21: qty 10

## 2017-11-21 MED ORDER — METHOCARBAMOL 500 MG PO TABS
500.0000 mg | ORAL_TABLET | Freq: Once | ORAL | Status: AC
  Administered 2017-11-21: 500 mg via ORAL

## 2017-11-21 MED ORDER — BUPIVACAINE LIPOSOME 1.3 % IJ SUSP
INTRAMUSCULAR | Status: AC
  Filled 2017-11-21: qty 20

## 2017-11-21 MED ORDER — CEFAZOLIN SODIUM-DEXTROSE 2-4 GM/100ML-% IV SOLN
2.0000 g | Freq: Once | INTRAVENOUS | Status: AC
  Administered 2017-11-21 (×2): 2 g via INTRAVENOUS

## 2017-11-21 MED ORDER — OXYCODONE HCL 5 MG/5ML PO SOLN: 5.0000 mg | Freq: Once | ORAL | Status: AC | PRN

## 2017-11-21 MED ORDER — LIDOCAINE HCL (CARDIAC) 20 MG/ML IV SOLN
INTRAVENOUS | Status: DC | PRN
  Administered 2017-11-21: 100 mg via INTRAVENOUS

## 2017-11-21 MED ORDER — SUGAMMADEX SODIUM 200 MG/2ML IV SOLN
INTRAVENOUS | Status: AC
Start: 2017-11-21 — End: 2017-11-21
  Filled 2017-11-21: qty 2

## 2017-11-21 MED ORDER — FENTANYL CITRATE (PF) 100 MCG/2ML IJ SOLN
INTRAMUSCULAR | Status: AC
  Filled 2017-11-21: qty 2

## 2017-11-21 MED ORDER — SUCCINYLCHOLINE CHLORIDE 20 MG/ML IJ SOLN
INTRAMUSCULAR | Status: AC
  Filled 2017-11-21: qty 1

## 2017-11-21 MED ORDER — ROCURONIUM BROMIDE 100 MG/10ML IV SOLN
INTRAVENOUS | Status: DC | PRN
  Administered 2017-11-21 (×4): 50 mg via INTRAVENOUS

## 2017-11-21 MED ORDER — PENTAFLUOROPROP-TETRAFLUOROETH EX AERO
INHALATION_SPRAY | CUTANEOUS | Status: DC
Start: 2017-11-21 — End: 2017-11-21
  Filled 2017-11-21: qty 30

## 2017-11-21 MED ORDER — SODIUM CHLORIDE 0.9 % IV SOLN
INTRAVENOUS | Status: DC | PRN
  Administered 2017-11-21: 30 ug/min via INTRAVENOUS

## 2017-11-21 MED ORDER — ONDANSETRON HCL 4 MG/2ML IJ SOLN
INTRAMUSCULAR | Status: AC
  Filled 2017-11-21: qty 2

## 2017-11-21 MED ORDER — OXYCODONE HCL 5 MG PO TABS
ORAL_TABLET | ORAL | Status: AC
  Filled 2017-11-21: qty 1

## 2017-11-21 MED ORDER — BUPIVACAINE HCL (PF) 0.5 % IJ SOLN
INTRAMUSCULAR | Status: AC
  Filled 2017-11-21: qty 30

## 2017-11-21 MED ORDER — OXYCODONE HCL 5 MG PO TABS: 5.0000 mg | ORAL_TABLET | ORAL | 0 refills | Status: AC | PRN

## 2017-11-21 MED ORDER — FENTANYL CITRATE (PF) 100 MCG/2ML IJ SOLN
25.0000 ug | INTRAMUSCULAR | Status: DC | PRN
  Administered 2017-11-21 (×3): 50 ug via INTRAVENOUS

## 2017-11-21 MED ORDER — DEXAMETHASONE SODIUM PHOSPHATE 10 MG/ML IJ SOLN
INTRAMUSCULAR | Status: DC | PRN
  Administered 2017-11-21: 4 mg via INTRAVENOUS

## 2017-11-21 MED ORDER — MIDAZOLAM HCL 2 MG/2ML IJ SOLN
INTRAMUSCULAR | Status: AC
  Administered 2017-11-21: 1 mg via INTRAVENOUS
  Filled 2017-11-21: qty 2

## 2017-11-21 MED ORDER — CEFAZOLIN SODIUM 1 G IJ SOLR
INTRAMUSCULAR | Status: AC
  Filled 2017-11-21: qty 20

## 2017-11-21 MED ORDER — PROPOFOL 10 MG/ML IV BOLUS
INTRAVENOUS | Status: AC
  Filled 2017-11-21: qty 20

## 2017-11-21 MED ORDER — PROMETHAZINE HCL 25 MG/ML IJ SOLN: 6.2500 mg | INTRAMUSCULAR | Status: DC | PRN

## 2017-11-21 MED ORDER — PROPOFOL 10 MG/ML IV BOLUS
INTRAVENOUS | Status: DC | PRN
  Administered 2017-11-21: 200 mg via INTRAVENOUS

## 2017-11-21 MED ORDER — LIDOCAINE HCL (PF) 1 % IJ SOLN
INTRAMUSCULAR | Status: DC | PRN
  Administered 2017-11-21: 3 mL

## 2017-11-21 MED ORDER — LIDOCAINE HCL (PF) 1 % IJ SOLN
INTRAMUSCULAR | Status: AC
  Filled 2017-11-21: qty 5

## 2017-11-21 MED ORDER — CEFAZOLIN SODIUM-DEXTROSE 2-4 GM/100ML-% IV SOLN
INTRAVENOUS | Status: AC
  Filled 2017-11-21: qty 100

## 2017-11-21 MED ORDER — FENTANYL CITRATE (PF) 100 MCG/2ML IJ SOLN
50.0000 ug | Freq: Once | INTRAMUSCULAR | Status: AC
  Administered 2017-11-21: 50 ug via INTRAVENOUS

## 2017-11-21 MED ORDER — LACTATED RINGERS IV SOLN: INTRAVENOUS | Status: DC

## 2017-11-21 MED ORDER — BUPIVACAINE HCL (PF) 0.5 % IJ SOLN
INTRAMUSCULAR | Status: AC
  Filled 2017-11-21: qty 10

## 2017-11-21 MED ORDER — OXYCODONE HCL 5 MG PO TABS
5.0000 mg | ORAL_TABLET | Freq: Once | ORAL | Status: AC | PRN
  Administered 2017-11-21: 5 mg via ORAL

## 2017-11-21 MED ORDER — MEPERIDINE HCL 50 MG/ML IJ SOLN: 6.2500 mg | INTRAMUSCULAR | Status: DC | PRN

## 2017-11-21 MED ORDER — DEXAMETHASONE SODIUM PHOSPHATE 10 MG/ML IJ SOLN
INTRAMUSCULAR | Status: AC
Start: 2017-11-21 — End: 2017-11-21
  Filled 2017-11-21: qty 1

## 2017-11-21 MED ORDER — BUPIVACAINE HCL (PF) 0.5 % IJ SOLN
INTRAMUSCULAR | Status: DC | PRN
  Administered 2017-11-21: 10 mL via PERINEURAL

## 2017-11-21 MED ORDER — BUPIVACAINE-EPINEPHRINE (PF) 0.5% -1:200000 IJ SOLN
INTRAMUSCULAR | Status: AC
  Filled 2017-11-21: qty 30

## 2017-11-21 SURGICAL SUPPLY — 93 items
ADAPTER IRRIG TUBE 2 SPIKE SOL (ADAPTER) ×4 IMPLANT
ADH SKN CLS APL DERMABOND .7 (GAUZE/BANDAGES/DRESSINGS) ×1
ADPR TBG 2 SPK PMP STRL ASCP (ADAPTER) ×2
ANCH SUT 2 SUTTK 12.7X3 KNTLS (SUTURE)
ANCH SUT SWLK 19.1X4.75 (Anchor) ×1 IMPLANT
ANCH SUT SWLK 19.1X5.5 CLS (Anchor) ×2 IMPLANT
ANCHOR SUT BIO SW 4.75X19.1 (Anchor) ×1 IMPLANT
ANCHOR SUTURETAK 3X12.7 BIOC (SUTURE) IMPLANT
ANCHOR SWIVELOCK BIO COMP (Anchor) ×2 IMPLANT
BLADE OSCILLATING/SAGITTAL (BLADE)
BLADE SW THK.38XMED LNG THN (BLADE) IMPLANT
BUR RADIUS 4.0X18.5 (BURR) ×2 IMPLANT
BUR RADIUS 5.5 (BURR) ×2 IMPLANT
CANNULA 5.75X7 CRYSTAL CLEAR (CANNULA) ×2 IMPLANT
CANNULA PARTIAL THREAD 2X7 (CANNULA) ×2 IMPLANT
CANNULA TWIST IN 8.25X9CM (CANNULA) ×1 IMPLANT
CHLORAPREP W/TINT 26ML (MISCELLANEOUS) ×2 IMPLANT
COOLER POLAR GLACIER W/PUMP (MISCELLANEOUS) ×2 IMPLANT
COVER LIGHT HANDLE STERIS (MISCELLANEOUS) ×2 IMPLANT
CRADLE LAMINECT ARM (MISCELLANEOUS) ×2 IMPLANT
DERMABOND ADVANCED (GAUZE/BANDAGES/DRESSINGS) ×1
DERMABOND ADVANCED .7 DNX12 (GAUZE/BANDAGES/DRESSINGS) ×1 IMPLANT
DRAPE IMP U-DRAPE 54X76 (DRAPES) ×3 IMPLANT
DRAPE INCISE IOBAN 66X45 STRL (DRAPES) ×2 IMPLANT
DRAPE SHEET LG 3/4 BI-LAMINATE (DRAPES) ×3 IMPLANT
DRAPE STERI 35X30 U-POUCH (DRAPES) ×2 IMPLANT
DRAPE U-SHAPE 47X51 STRL (DRAPES) ×4 IMPLANT
ELECT REM PT RETURN 9FT ADLT (ELECTROSURGICAL) ×2
ELECTRODE REM PT RTRN 9FT ADLT (ELECTROSURGICAL) ×1 IMPLANT
GAUZE PETRO XEROFOAM 1X8 (MISCELLANEOUS) ×2 IMPLANT
GAUZE SPONGE 4X4 12PLY STRL (GAUZE/BANDAGES/DRESSINGS) ×2 IMPLANT
GLOVE BIOGEL PI IND STRL 8 (GLOVE) ×1 IMPLANT
GLOVE BIOGEL PI INDICATOR 8 (GLOVE) ×5
GLOVE SURG SYN 7.5  E (GLOVE) ×5
GLOVE SURG SYN 7.5 E (GLOVE) ×5 IMPLANT
GLOVE SURG SYN 7.5 PF PI (GLOVE) ×1 IMPLANT
GOWN STRL REUS W/ TWL LRG LVL3 (GOWN DISPOSABLE) ×1 IMPLANT
GOWN STRL REUS W/TWL LRG LVL3 (GOWN DISPOSABLE) ×4
GOWN STRL REUS W/TWL LRG LVL4 (GOWN DISPOSABLE) ×2 IMPLANT
IMP SYSTEM BRIDGE 4.75X19.1 (Anchor) ×2 IMPLANT
IMPL SYSTEM BRIDGE 4.75X19.1 (Anchor) IMPLANT
IV LACTATED RINGER IRRG 3000ML (IV SOLUTION) ×90
IV LR IRRIG 3000ML ARTHROMATIC (IV SOLUTION) ×4 IMPLANT
KIT PERC INSERT 3.0 KNTLS (KITS) ×1 IMPLANT
KIT RM TURNOVER STRD PROC AR (KITS) ×2 IMPLANT
KIT STABILIZATION SHOULDER (MISCELLANEOUS) ×2 IMPLANT
KIT SUTURETAK 3.0 INSERT PERC (KITS) ×1 IMPLANT
MANIFOLD NEPTUNE II (INSTRUMENTS) ×3 IMPLANT
MASK FACE SPIDER DISP (MASK) ×2 IMPLANT
MAT ABSORB  FLUID 56X50 GRAY (MISCELLANEOUS) ×2
MAT ABSORB FLUID 56X50 GRAY (MISCELLANEOUS) ×2 IMPLANT
NDL MAYO 6 CRC TAPER PT (NEEDLE) IMPLANT
NDL SAFETY ECLIPSE 18X1.5 (NEEDLE) ×1 IMPLANT
NDL SCORPION MULTI FIRE (NEEDLE) IMPLANT
NEEDLE HYPO 18GX1.5 SHARP (NEEDLE) ×2
NEEDLE HYPO 22GX1.5 SAFETY (NEEDLE) ×2 IMPLANT
NEEDLE MAYO 6 CRC TAPER PT (NEEDLE) IMPLANT
NEEDLE SCORPION MULTI FIRE (NEEDLE) ×2 IMPLANT
PACK ARTHROSCOPY SHOULDER (MISCELLANEOUS) ×2 IMPLANT
PAD ABD DERMACEA PRESS 5X9 (GAUZE/BANDAGES/DRESSINGS) ×2 IMPLANT
PAD WRAPON POLAR SHDR XLG (MISCELLANEOUS) ×1 IMPLANT
SET TUBE SUCT SHAVER OUTFL 24K (TUBING) ×2 IMPLANT
SET TUBE TIP INTRA-ARTICULAR (MISCELLANEOUS) ×2 IMPLANT
SLING ULTRA II M (MISCELLANEOUS) ×2 IMPLANT
STAPLER SKIN PROX 35W (STAPLE) IMPLANT
STRAP SAFETY BODY (MISCELLANEOUS) ×3 IMPLANT
STRIP CLOSURE SKIN 1/2X4 (GAUZE/BANDAGES/DRESSINGS) ×1 IMPLANT
SUT ETHILON 3-0 (SUTURE) IMPLANT
SUT ETHILON 4-0 (SUTURE) ×2
SUT ETHILON 4-0 FS2 18XMFL BLK (SUTURE) ×1
SUT FIBERWIRE #2 38 T-5 BLUE (SUTURE) ×2
SUT LASSO 90 DEG SD STR (SUTURE) ×2 IMPLANT
SUT MNCRL 4-0 (SUTURE) ×2
SUT MNCRL 4-0 27XMFL (SUTURE) ×1
SUT PROLENE 0 CT 2 (SUTURE) IMPLANT
SUT PROLENE 6 0 P 1 18 (SUTURE) IMPLANT
SUT TICRON 2-0 30IN 311381 (SUTURE) IMPLANT
SUT VIC AB 0 CT1 36 (SUTURE) ×2 IMPLANT
SUT VIC AB 2-0 CT2 27 (SUTURE) ×1 IMPLANT
SUT VICRYL 3-0 27IN (SUTURE) ×1 IMPLANT
SUTURE ETHLN 4-0 FS2 18XMF BLK (SUTURE) ×1 IMPLANT
SUTURE FIBERWR #2 38 T-5 BLUE (SUTURE) IMPLANT
SUTURE MNCRL 4-0 27XMF (SUTURE) ×1 IMPLANT
SUTURE TAPE 1.3 40 TPR END (SUTURE) IMPLANT
SUTURETAPE 1.3 40 TPR END (SUTURE) ×2
SYR 10ML LL (SYRINGE) ×1 IMPLANT
Suture Anchor IMPLANT
TAPE CLOTH 3X10 WHT NS LF (GAUZE/BANDAGES/DRESSINGS) ×1 IMPLANT
TAPE MICROFOAM 4IN (TAPE) ×2 IMPLANT
TUBING ARTHRO INFLOW-ONLY STRL (TUBING) ×2 IMPLANT
TUBING CONNECTING 10 (TUBING) ×2 IMPLANT
WAND HAND CNTRL MULTIVAC 90 (MISCELLANEOUS) IMPLANT
WRAPON POLAR PAD SHDR XLG (MISCELLANEOUS) ×2

## 2017-11-21 NOTE — OR Nursing (Signed)
Discussed discharge instructions with pt and friend. Pt not paying attention to instructions wanted to hurry up and leave so she could get her check by 5:00pm. Friend somewhat paying attention. Copy of instructions given to pt and friend, Instructed about exparell also. Both state understanding.

## 2017-11-21 NOTE — H&P (Signed)
Paper H&P to be scanned into permanent record. H&P reviewed. No significant changes noted.  

## 2017-11-21 NOTE — Transfer of Care (Signed)
Immediate Anesthesia Transfer of Care Note  Patient: Colleen Mejia [facility]  Procedure(s) Performed: SHOULDER ARTHROSCOPY WITH OPEN ROTATOR CUFF REPAIR (Right )  Patient Location: PACU  Anesthesia Type:General  Level of Consciousness: drowsy and patient cooperative  Airway & Oxygen Therapy: Patient Spontanous Breathing and Patient connected to face mask oxygen  Post-op Assessment: Report given to RN and Post -op Vital signs reviewed and stable  Post vital signs: Reviewed and stable  Last Vitals:  Vitals:   11/21/17 0935 11/21/17 0940  BP: 107/69   Pulse: 82 81  Resp: 14 20  Temp:    SpO2: 98% 99%    Last Pain:  Vitals:   11/21/17 0915  TempSrc:   PainSc: 0-No pain         Complications: No apparent anesthesia complications
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
## 2017-11-21 NOTE — Progress Notes (Signed)
Muscle relaxant given along with pain medication
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
## 2017-11-21 NOTE — Progress Notes (Signed)
Thrashing hair back and forth  Not making sense

## 2017-11-21 NOTE — Discharge Instructions (Addendum)
Post-Op Instructions - Rotator Cuff Repair  1. Bracing: You will wear a shoulder immobilizer or sling for 6 weeks.   2. Driving: No driving for 3 weeks post-op. When driving, do not wear the immobilizer. Ideally, we recommend no driving for 6 weeks while sling is in place as one arm will be immobilized.   3. Activity: No active lifting for 2 months. Wrist, hand, and elbow motion only. Avoid lifting the upper arm away from the body except for hygiene. You are permitted to bend and straighten the elbow passively only (no active elbow motion). You may use your hand and wrist for typing, writing, and managing utensils (cutting food). Do not lift more than a coffee cup for 8 weeks.  When sleeping or resting, inclined positions (recliner chair or wedge pillow) and a pillow under the forearm for support may provide better comfort for up to 4 weeks.  Avoid long distance travel for 4 weeks.  Return to normal activities after rotator cuff repair repair normally takes 6 months on average. If rehab goes very well, may be able to do most activities at 4 months, except overhead or contact sports.  4. Physical Therapy: Begins 3-4 days after surgery, and proceed 1 time per week for the first 6 weeks, then 1-2 times per week from weeks 6-20 post-op.  5. Medications:  - You will be provided a prescription for narcotic pain medicine. After surgery, take 1-2 narcotic tablets every 4 hours if needed for severe pain.  - A prescription for anti-nausea medication will be provided in case the narcotic medicine causes nausea - take 1 tablet every 6 hours only if nauseated.   - Take tylenol 1000 mg (2 Extra Strength tablets or 3 regular strength) every 8 hours for pain.  May decrease or stop tylenol 5 days after surgery if you are having minimal pain. - DO NOT take ANY nonsteroidal anti-inflammatory pain medications (Advil, Motrin, Ibuprofen, Aleve, Naproxen, or Naprosyn). These medicines can inhibit healing of your shoulder  repair.    If you are taking prescription medication for anxiety, depression, insomnia, muscle spasm, chronic pain, or for attention deficit disorder, you are advised that you are at a higher risk of adverse effects with use of narcotics post-op, including narcotic addiction/dependence, depressed breathing, death. If you use non-prescribed substances: alcohol, marijuana, cocaine, heroin, methamphetamines, etc., you are at a higher risk of adverse effects with use of narcotics post-op, including narcotic addiction/dependence, depressed breathing, death. You are advised that taking > 50 morphine milligram equivalents (MME) of narcotic pain medication per day results in twice the risk of overdose or death. For your prescription provided: oxycodone 5 mg - taking more than 6 tablets per day would result in > 50 morphine milligram equivalents (MME) of narcotic pain medication. Be advised that we will prescribe narcotics short-term, for acute post-operative pain only - 3 weeks for major operations such as shoulder repair/reconstruction surgeries.     6. Post-Op Appointment:  Your first post-op appointment will be 10-14 days post-op.  7. Work or School: For most, but not all procedures, we advise staying out of work or school for at least 1 to 2 weeks in order to recover from the stress of surgery and to allow time for healing.   If you need a work or school note this can be provided.   8. Smoking: If you are a smoker, you need to refrain from smoking in the postoperative period. The nicotine in cigarettes will inhibit healing of your  shoulder repair and decrease the chance of successful repair. Similarly, nicotine containing products (gum, patches) should be avoided.   Post-operative Brace: Apply and remove the brace you received as you were instructed to at the time of fitting and as described in detail as the braces instructions for use indicate.  Wear the brace for the period of time prescribed by  your physician.  The brace can be cleaned with soap and water and allowed to air dry only.  Should the brace result in increased pain, decreased feeling (numbness/tingling), increased swelling or an overall worsening of your medical condition, please contact your doctor immediately.  If an emergency situation occurs as a result of wearing the brace after normal business hours, please dial 911 and seek immediate medical attention.  Let your doctor know if you have any further questions about the brace issued to you. Refer to the shoulder sling instructions for use if you have any questions regarding the correct fit of your shoulder sling.  [facility]: [phone]  Video that illustrates how to properly use a shoulder sling: "Instructions for Proper Use of an Orthopaedic Sling" [email or web address]        Interscalene Nerve Block with Exparel  1.  For your surgery you have received an Interscalene Nerve Block with Exparel. 2. Nerve Blocks affect many types of nerves, including nerves that control movement, pain and normal sensation.  You may experience feelings such as numbness, tingling, heaviness, weakness or the inability to move your arm or the feeling or sensation that your arm has "fallen asleep". 3. A nerve block with Exparel can last up to 5 days.  Usually the weakness wears off first.  The tingling and heaviness usually wear off next.  Finally you may start to notice pain.  Keep in mind that this may occur in any order.  Once a nerve block starts to wear off it is usually completely gone within 60 minutes. 4. ISNB may cause mild shortness of breath, a hoarse voice, blurry vision, unequal pupils, or drooping of the face on the same side as the nerve block.  These symptoms will usually resolve with the numbness.  Very rarely the procedure itself can cause mild seizures. 5. If needed, your surgeon will give you a prescription for pain  medication.  It will take about 60 minutes for the oral pain medication to become fully effective.  So, it is recommended that you start taking this medication before the nerve block first begins to wear off, or when you first begin to feel discomfort. 6. Take your pain medication only as prescribed.  Pain medication can cause sedation and decrease your breathing if you take more than you need for the level of pain that you have. 7. Nausea is a common side effect of many pain medications.  You may want to eat something before taking your pain medicine to prevent nausea. 8. After an Interscalene nerve block, you cannot feel pain, pressure or extremes in temperature in the effected arm.  Because your arm is numb it is at an increased risk for injury.  To decrease the possibility of injury, please practice the following:  a. While you are awake change the position of your arm frequently to prevent too much pressure on any one area for prolonged periods of time. b.  If you have a cast or tight dressing, check the color or your fingers every couple of hours.  Call your surgeon with the appearance of any discoloration (white  or blue). c. If you are given a sling to wear before you go home, please wear it  at all times until the block has completely worn off.  Do not get up at night without your sling. d. Please contact [facility] or your surgeon if you do not begin to regain sensation after 7 days from the surgery.  Anesthesia may be contacted by calling the Same Day Surgery Department, Mon. through Fri., 6 am to 4 pm at [phone].   e. If you experience any other problems or concerns, please contact your surgeon's office. f. If you experience severe or prolonged shortness of breath go to the nearest emergency department.

## 2017-11-21 NOTE — Anesthesia Post-op Follow-up Note (Signed)
Anesthesia QCDR form completed.        

## 2017-11-21 NOTE — Anesthesia Procedure Notes (Signed)
Anesthesia Regional Block: Interscalene brachial plexus block   Pre-Anesthetic Checklist: ,, timeout performed, Correct Patient, Correct Site, Correct Laterality, Correct Procedure, Correct Position, site marked, Risks and benefits discussed,  Surgical consent,  Pre-op evaluation,  At surgeon's request and post-op pain management  Laterality: Right  Prep: chloraprep       Needles:  Injection technique: Single-shot  Needle Type: Stimiplex     Needle Length: 10cm  Needle Gauge: 20     Additional Needles:   Procedures:,,,, ultrasound used (permanent image in chart),,,,  Narrative:  Start time: 11/21/2017 9:20 AM End time: 11/21/2017 9:28 AM Injection made incrementally with aspirations every 5 mL.  Performed by: Personally  Anesthesiologist: Emmie Niemann, MD  Additional Notes: Functioning IV was confirmed and monitors were applied.  A Stimuplex needle was used. Sterile prep and drape,hand hygiene and sterile gloves were used.  Negative aspiration and negative test dose prior to incremental administration of local anesthetic. The patient tolerated the procedure well.

## 2017-11-21 NOTE — Anesthesia Procedure Notes (Signed)
Procedure Name: Intubation Date/Time: 11/21/2017 9:53 AM Performed by: Silvana Newness, CRNA Pre-anesthesia Checklist: Patient identified, Emergency Drugs available, Suction available, Patient being monitored and Timeout performed Patient Re-evaluated:Patient Re-evaluated prior to induction Oxygen Delivery Method: Circle system utilized Preoxygenation: Pre-oxygenation with 100% oxygen Induction Type: IV induction Ventilation: Mask ventilation without difficulty Laryngoscope Size: McGraph and 3 Grade View: Grade I Tube type: Oral Tube size: 7.0 mm Number of attempts: 1 Airway Equipment and Method: Stylet Placement Confirmation: ETT inserted through vocal cords under direct vision,  positive ETCO2 and breath sounds checked- equal and bilateral Secured at: 20 cm Tube secured with: Tape Dental Injury: Teeth and Oropharynx as per pre-operative assessment  Comments: Used McGraph because patient has history of cervical issues.  No extension or flexion of head during intubation.  No problems intubating

## 2017-11-24 ENCOUNTER — Encounter: Payer: Self-pay | Admitting: Orthopedic Surgery

## 2017-11-24 NOTE — Anesthesia Postprocedure Evaluation (Signed)
Anesthesia Post Note  Patient: Colleen Mejia [facility]  Procedure(s) Performed: SHOULDER ARTHROSCOPY WITH OPEN ROTATOR CUFF REPAIR (Right )  Patient location during evaluation: PACU Anesthesia Type: General Level of consciousness: awake and alert and oriented Pain management: pain level controlled Vital Signs Assessment: post-procedure vital signs reviewed and stable Respiratory status: spontaneous breathing, nonlabored ventilation and respiratory function stable Cardiovascular status: blood pressure returned to baseline and stable Postop Assessment: no signs of nausea or vomiting Anesthetic complications: no     Last Vitals:  Vitals:   11/21/17 1624 11/21/17 1655  BP: 124/66 124/69  Pulse: 96 91  Resp: 18 18  Temp: 36.7 C   SpO2: 93% 94%    Last Pain:  Vitals:   11/21/17 1655  TempSrc:   PainSc: 4                  Hoang Reich

## 2018-01-15 ENCOUNTER — Encounter: Payer: Self-pay | Admitting: Orthopedic Surgery

## 2018-10-05 ENCOUNTER — Encounter: Payer: Self-pay | Admitting: Emergency Medicine

## 2018-10-05 ENCOUNTER — Emergency Department: Payer: Medicare Other

## 2018-10-05 ENCOUNTER — Emergency Department
Admission: EM | Admit: 2018-10-05 | Discharge: 2018-10-05 | Disposition: A | Discharge status: Home / Self Care | Payer: Medicare Other | Attending: Emergency Medicine | Admitting: Emergency Medicine

## 2018-10-05 DIAGNOSIS — Y998 Other external cause status: Secondary | ICD-10-CM | POA: Insufficient documentation

## 2018-10-05 DIAGNOSIS — Z79899 Other long term (current) drug therapy: Secondary | ICD-10-CM | POA: Insufficient documentation

## 2018-10-05 DIAGNOSIS — E119 Type 2 diabetes mellitus without complications: Secondary | ICD-10-CM | POA: Insufficient documentation

## 2018-10-05 DIAGNOSIS — Y93K1 Activity, walking an animal: Secondary | ICD-10-CM | POA: Diagnosis not present

## 2018-10-05 DIAGNOSIS — I10 Essential (primary) hypertension: Secondary | ICD-10-CM | POA: Diagnosis not present

## 2018-10-05 DIAGNOSIS — S93402A Sprain of unspecified ligament of left ankle, initial encounter: Secondary | ICD-10-CM | POA: Insufficient documentation

## 2018-10-05 DIAGNOSIS — F17210 Nicotine dependence, cigarettes, uncomplicated: Secondary | ICD-10-CM | POA: Insufficient documentation

## 2018-10-05 DIAGNOSIS — Y929 Unspecified place or not applicable: Secondary | ICD-10-CM | POA: Insufficient documentation

## 2018-10-05 DIAGNOSIS — S99912A Unspecified injury of left ankle, initial encounter: Secondary | ICD-10-CM | POA: Diagnosis present

## 2018-10-05 DIAGNOSIS — W010XXA Fall on same level from slipping, tripping and stumbling without subsequent striking against object, initial encounter: Secondary | ICD-10-CM | POA: Diagnosis not present

## 2018-10-05 MED ORDER — IBUPROFEN 600 MG PO TABS: 600.0000 mg | ORAL_TABLET | Freq: Three times a day (TID) | ORAL | 0 refills | Status: DC | PRN

## 2018-10-05 MED ORDER — TRAMADOL HCL 50 MG PO TABS
50.0000 mg | ORAL_TABLET | Freq: Once | ORAL | Status: AC
  Administered 2018-10-05: 50 mg via ORAL
  Filled 2018-10-05: qty 1

## 2018-10-05 MED ORDER — TRAMADOL HCL 50 MG PO TABS: 50.0000 mg | ORAL_TABLET | Freq: Two times a day (BID) | ORAL | 0 refills | Status: DC | PRN

## 2018-10-05 MED ORDER — IBUPROFEN 600 MG PO TABS
600.0000 mg | ORAL_TABLET | Freq: Once | ORAL | Status: AC
  Administered 2018-10-05: 600 mg via ORAL
  Filled 2018-10-05: qty 1

## 2018-10-05 NOTE — ED Triage Notes (Signed)
Pt reports was walking her dog Friday and his cord got tangled and she tripped popping her left ankle and falling. Denies other injuries.
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
## 2018-10-05 NOTE — ED Notes (Signed)
See triage note  Presents with pain and swelling to left ankle  States pain moves into 5 th toe and into lower leg  Good pulse  Positive swelling

## 2019-01-15 ENCOUNTER — Other Ambulatory Visit: Payer: Self-pay | Admitting: Podiatry

## 2019-01-15 DIAGNOSIS — D489 Neoplasm of uncertain behavior, unspecified: Secondary | ICD-10-CM

## 2019-01-26 ENCOUNTER — Ambulatory Visit: Admission: RE | Admit: 2019-01-26 | Payer: Medicare Other | Source: Ambulatory Visit

## 2019-01-29 ENCOUNTER — Other Ambulatory Visit: Payer: Self-pay

## 2019-01-29 ENCOUNTER — Ambulatory Visit
Admission: RE | Admit: 2019-01-29 | Discharge: 2019-01-29 | Disposition: A | Discharge status: Home / Self Care | Payer: Medicare Other | Source: Ambulatory Visit | Attending: Podiatry | Admitting: Podiatry

## 2019-01-29 DIAGNOSIS — D489 Neoplasm of uncertain behavior, unspecified: Secondary | ICD-10-CM | POA: Diagnosis present

## 2019-01-29 MED ORDER — GADOBUTROL 1 MMOL/ML IV SOLN
9.0000 mL | Freq: Once | INTRAVENOUS | Status: AC | PRN
  Administered 2019-01-29: 9 mL via INTRAVENOUS

## 2019-02-02 ENCOUNTER — Ambulatory Visit
Payer: Medicare Other | Attending: Student in an Organized Health Care Education/Training Program | Admitting: Student in an Organized Health Care Education/Training Program

## 2019-02-02 ENCOUNTER — Other Ambulatory Visit: Payer: Self-pay

## 2019-02-02 ENCOUNTER — Encounter: Payer: Self-pay | Admitting: Student in an Organized Health Care Education/Training Program

## 2019-02-02 VITALS — BP 128/67 | HR 87 | Temp 98.6°F | Resp 16 | Ht 64.0 in | Wt 208.0 lb

## 2019-02-02 DIAGNOSIS — M797 Fibromyalgia: Secondary | ICD-10-CM

## 2019-02-02 DIAGNOSIS — M5412 Radiculopathy, cervical region: Secondary | ICD-10-CM

## 2019-02-02 DIAGNOSIS — G8929 Other chronic pain: Secondary | ICD-10-CM

## 2019-02-02 DIAGNOSIS — M7918 Myalgia, other site: Secondary | ICD-10-CM | POA: Diagnosis present

## 2019-02-02 DIAGNOSIS — F411 Generalized anxiety disorder: Secondary | ICD-10-CM

## 2019-02-02 DIAGNOSIS — G894 Chronic pain syndrome: Secondary | ICD-10-CM

## 2019-02-02 DIAGNOSIS — Z8659 Personal history of other mental and behavioral disorders: Secondary | ICD-10-CM

## 2019-02-02 DIAGNOSIS — M47816 Spondylosis without myelopathy or radiculopathy, lumbar region: Secondary | ICD-10-CM | POA: Diagnosis present

## 2019-02-02 DIAGNOSIS — M5416 Radiculopathy, lumbar region: Secondary | ICD-10-CM

## 2019-02-02 DIAGNOSIS — M4802 Spinal stenosis, cervical region: Secondary | ICD-10-CM | POA: Diagnosis present

## 2019-02-02 DIAGNOSIS — F1990 Other psychoactive substance use, unspecified, uncomplicated: Secondary | ICD-10-CM | POA: Diagnosis present

## 2019-02-02 DIAGNOSIS — F1490 Cocaine use, unspecified, uncomplicated: Secondary | ICD-10-CM | POA: Diagnosis present

## 2019-02-02 DIAGNOSIS — M961 Postlaminectomy syndrome, not elsewhere classified: Secondary | ICD-10-CM

## 2019-02-02 DIAGNOSIS — M47812 Spondylosis without myelopathy or radiculopathy, cervical region: Secondary | ICD-10-CM | POA: Diagnosis not present

## 2019-02-02 MED ORDER — PREGABALIN 100 MG PO CAPS: 100.0000 mg | ORAL_CAPSULE | Freq: Three times a day (TID) | ORAL | 1 refills | Status: DC

## 2019-02-02 NOTE — Progress Notes (Signed)
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  . Diabetes mellitus, type 2 ([place]) 09/23/2015  . Fibromyalgia 09/23/2015  . Acid reflux 09/23/2015  . HLD (hyperlipidemia) 09/23/2015  . BP (high blood pressure) 09/23/2015  . Long term current use of opiate analgesic 09/23/2015  . Long term prescription opiate use 09/23/2015  . Opiate use 09/23/2015  . Opiate dependence ([place]) 09/23/2015  . Encounter for therapeutic drug level monitoring 09/23/2015  . Encounter for chronic pain management 09/23/2015  . Chronic neck pain 09/23/2015  . Cervical spondylosis 09/23/2015  . Musculoskeletal pain 09/23/2015  . Myofascial pain 09/23/2015  . Lumbar spondylosis 09/23/2015  . Chronic left lower extremity pain 09/23/2015  . Chronic lumbar radicular pain (Bilateral) (L>R) 09/23/2015  . Chronic upper back pain 09/23/2015  . GERD  (gastroesophageal reflux disease) 09/23/2015  . History of psychiatric care 09/23/2015  . History of psychiatric disorder 09/23/2015  . Bipolar disorder ([place]) 09/23/2015  . Bronchial asthma 09/23/2015  . Generalized anxiety disorder 09/23/2015  . Substance use disorder (Very High Risk) 09/23/2015  . Hypothyroidism 09/23/2015  . Lumbar facet syndrome 09/23/2015  . Chronic hip pain (Bilateral) 09/23/2015  . Chronic sacroiliac joint pain (Bilateral) 09/23/2015  . Chronic bilateral knee pain 09/23/2015  . 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 Chronic postoperative pain 09/23/2015  . Cervical spinal stenosis (9 mm at C4-5 and C5-6, and 8 mm at C6-7) 09/23/2015  . Cervical foraminal stenosis (right C4-5 and bilateral C5-6 and C6-7) 09/23/2015  . Chronic upper extremity pain (Bilateral) 09/23/2015  . Cervical facet syndrome (Bilateral) 09/23/2015  . Chronic cervical radicular pain (Bilateral) 09/23/2015  . Carpal tunnel syndrome (Bilateral) 09/23/2015  . 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 Neurogenic pain 09/23/2015  . Neuropathic pain 09/23/2015  . Type 2 diabetes mellitus ([place]) 09/26/2015  . Cocaine use (see 09/26/2015 UDS) 10/10/2015   Resolved Ambulatory Problems    Diagnosis Date Noted  . No Resolved Ambulatory Problems   Past Medical History:  Diagnosis Date  . Anemia   . Asthma   . Chronic back pain   . Complication of anesthesia   . Diabetes mellitus without complication ([place])   . Hypertension   . Osteoarthritis   . Right arm numbness    Constitutional Exam  General appearance: Well nourished, well developed, and well hydrated. In no apparent acute distress Vitals:   02/02/19 0820  BP: 128/67  Pulse: 87  Resp: 16  Temp: 98.6 F (37 C)  TempSrc: Oral  SpO2: 96%  Weight: 208 lb (94.3 kg)  Height: _0  (1.626 m)   BMI Assessment: Estimated body mass index is 35.7 kg/m as calculated from the following:   Height as of this  encounter: _1  (1.626 m).   Weight as of this encounter: 208 lb (94.3 kg).  BMI interpretation table: BMI level Category Range association with higher incidence of chronic pain  <18 kg/m2 Underweight   18.5-24.9 kg/m2 Ideal body weight   25-29.9 kg/m2 Overweight Increased incidence by 20%  30-34.9 kg/m2 Obese (Class I) Increased incidence by 68%  35-39.9 kg/m2 Severe obesity (Class II) Increased incidence by 136%  >40 kg/m2 Extreme obesity (Class III) Increased incidence by 254%   Patient's current BMI Ideal Body weight  Body mass index is 35.7 kg/m. Ideal body weight: 54.7 kg (120 lb 9.5 oz) Adjusted ideal body weight: 70.6 kg (155 lb 8.9 oz)   BMI Readings  from Last 4 Encounters:  02/02/19 35.70 kg/m  10/05/18 35.70 kg/m  11/19/17 32.44 kg/m  09/28/17 31.07 kg/m   Wt Readings from Last 4 Encounters:  02/02/19 208 lb (94.3 kg)  10/05/18 208 lb (94.3 kg)  11/19/17 189 lb (85.7 kg)  09/28/17 181 lb (82.1 kg)  Psych/Mental status: Alert, oriented x 3 (person, place, & time)       Eyes: PERLA Respiratory: No evidence of acute respiratory distress  Cervical Spine Area Exam  Skin & Axial Inspection: Well healed scar from previous spine surgery detected Alignment: Asymmetric Functional ROM: Pain restricted ROM, bilaterally Stability: No instability detected Muscle Tone/Strength: Functionally intact. No obvious neuro-muscular anomalies detected. Sensory (Neurological): Dermatomal pain pattern left greater than right   Upper Extremity (UE) Exam    Side: Right upper extremity  Side: Left upper extremity  Skin & Extremity Inspection: Evidence of prior arthroplastic surgery  Skin & Extremity Inspection: Skin color, temperature, and hair growth are WNL. No peripheral edema or cyanosis. No masses, redness, swelling, asymmetry, or associated skin lesions. No contractures.  Functional ROM: Mechanically restricted ROM for shoulder and elbow  Functional ROM: Unrestricted ROM           Muscle Tone/Strength: Functionally intact. No obvious neuro-muscular anomalies detected.  Muscle Tone/Strength: Functionally intact. No obvious neuro-muscular anomalies detected.  Sensory (Neurological): Dermatomal pain pattern and musculoskeletal          Sensory (Neurological): Dermatomal pain pattern          Palpation: No palpable anomalies              Palpation: No palpable anomalies              Provocative Test(s):  Phalen's test: deferred Tinel's test: deferred Apley's scratch test (touch opposite shoulder):  Action 1 (Across chest): Decreased ROM Action 2 (Overhead): Decreased ROM Action 3 (LB reach): Decreased ROM   Provocative Test(s):  Phalen's test: deferred Tinel's test: deferred Apley's scratch test (touch opposite shoulder):  Action 1 (Across chest): Decreased ROM Action 2 (Overhead): Decreased ROM Action 3 (LB reach): Decreased ROM   Positive Spurling's on left Thoracic Spine Area Exam  Skin & Axial Inspection: No masses, redness, or swelling Alignment: Symmetrical Functional ROM: Decreased ROM Stability: No instability detected Muscle Tone/Strength: Functionally intact. No obvious neuro-muscular anomalies detected. Sensory (Neurological): Musculoskeletal pain pattern Muscle strength & Tone: No palpable anomalies  Lumbar Spine Area Exam  Skin & Axial Inspection: No masses, redness, or swelling Alignment: Symmetrical Functional ROM: Pain restricted ROM       Stability: No instability detected Muscle Tone/Strength: Functionally intact. No obvious neuro-muscular anomalies detected. Sensory (Neurological): Musculoskeletal pain pattern and dermatomal, right lower extremity  Provocative Tests: Hyperextension/rotation test: (+) bilaterally for facet joint pain. Lumbar quadrant test (Kemp's test): (+) on the left for foraminal stenosis Lateral bending test: (+) ipsilateral radicular pain, on the left. Positive for left-sided foraminal stenosis. Patrick's Maneuver:  (+) for bilateral S-I arthralgia             FABER* test: deferred today                   S-I anterior distraction/compression test: deferred today         S-I lateral compression test: deferred today         S-I Thigh-thrust test: deferred today         S-I Gaenslen's test: deferred today         *(Flexion, ABduction and External  Rotation)  Gait & Posture Assessment  Ambulation: Unassisted Gait: Relatively normal for age and body habitus Posture: WNL   Lower Extremity Exam    Side: Right lower extremity  Side: Left lower extremity  Stability: No instability observed          Stability: No instability observed          Skin & Extremity Inspection: Skin color, temperature, and hair growth are WNL. No peripheral edema or cyanosis. No masses, redness, swelling, asymmetry, or associated skin lesions. No contractures.  Skin & Extremity Inspection: Skin color, temperature, and hair growth are WNL. No peripheral edema or cyanosis. No masses, redness, swelling, asymmetry, or associated skin lesions. No contractures.  Functional ROM: Unrestricted ROM                  Functional ROM: Unrestricted ROM                  Muscle Tone/Strength: Functionally intact. No obvious neuro-muscular anomalies detected.  Muscle Tone/Strength: Functionally intact. No obvious neuro-muscular anomalies detected.  Sensory (Neurological): Unimpaired        Sensory (Neurological): Unimpaired        DTR: Patellar: deferred today Achilles: deferred today Plantar: deferred today  DTR: Patellar: deferred today Achilles: deferred today Plantar: deferred today  Palpation: No palpable anomalies  Palpation: No palpable anomalies   Assessment  Primary Diagnosis & Pertinent Problem List: The primary encounter diagnosis was Chronic pain syndrome. 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  Visit Diagnosis (New problems to examiner): 1. Chronic pain syndrome   2. Fibromyalgia   3. Cervical spondylosis   4. Musculoskeletal pain   5. Lumbar spondylosis   6. Chronic lumbar radicular pain (Bilateral) (L>R)   7. History of psychiatric disorder   8. Cocaine use (see 09/26/2015 UDS)   9. Generalized anxiety disorder   10. Substance use disorder (Very High Risk)   11. 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 Cervical spinal stenosis (9 mm at C4-5 and C5-6, and 8 mm at C6-7)   13. Chronic cervical radicular pain (Bilateral)    48 year old female with multiple pain complaints including low back pain with radiation into right leg causing numbness and difficulty with gait.  Patient also has neck pain that is proceeding into left arm and causing hand weakness and numbness.  This is been present for many years but has gotten worse over the last couple of months.  Patient does have ACDF at C6-C7. Of note, patient was previously seen by Dr. Dossie Arbour in December 2016.  She was having similar pain complaints at that time including neck pain, back pain and shoulder pain.  Urine drug screen at that time was positive for cocaine and patient was told that she would only be an interventional candidate here.  Patient did not follow-up thereafter.  She is being referred here by her primary care physician.  Patient has been receiving tramadol and most recent prescription of hydrocodone through PCP although these have been intermittent and short-term prescriptions.  Patient has been referred to physical therapy however given the current coronavirus situation this is on hold.  She denies any bowel or bladder issues.   Cervical MRI from 2018 shows disc bulge at C5-C6 with broad central disc protrusion contacting the ventral cervical spinal cord along with severe bilateral foraminal stenosis and mild spinal stenosis.  Patient does have an anterior cervical fusion at C6-C7 without foraminal or central canal stenosis.  Patient's neck pain is worse when she turns her head to the left causing paresthesias of her left hand.  Patient also has a history of fibromyalgia and complains of diffuse whole body pain.  She is complaining of increased muscle spasms.   I had extensive discussion with the patient about treatment plan.  I was very clear and direct and informed the patient that she would not be a candidate for chronic opioid therapy at this clinic given reasons listed above in historical pharmacotherapy review but primarily because of her psychiatric disorder including bipolar, multiple personality disorder as well as previous substance abuse history including documented cocaine abuse while she was a patient at this clinic briefly in 2016.  I did inform the patient that I could assist and support her via interventional therapies.  Given the patient's C5-C6 disc protrusion resulting in cervical radicular symptoms, we discussed cervical epidural steroid injection.  Risks and benefits of this procedure were discussed in great detail.  Patient would like to proceed.  Regards to her low back pain with radiation into her right lower extremity, this is likely secondary to lumbar disc degeneration resulting in lumbar radicular symptoms.  After cervical ESI, can discuss lumbar epidural steroid injection for her symptoms.  Patient requesting refill of Lyrica at previous dose.  Will provide refill and instructed patient to follow-up with PCP for continued medication management.  Also recommended the patient continue with physical therapy.  We discussed TENS unit and patient would like to try that.  I informed her that physical  therapy could help her get that or she can order online.   Ordered Lab-work, Procedure(s), Referral(s), & Consult(s): Orders Placed This Encounter  Procedures  . Cervical Epidural Injection  . Compliance Drug Analysis, Ur   Pharmacotherapy (current): Medications ordered:  Meds ordered this encounter  Medications  . DISCONTD: pregabalin (LYRICA) 100 MG capsule    Sig: Take 1 capsule (100 mg total) by mouth 3 (three) times daily.    Dispense:  90 capsule    Refill:  1    Do not place this medication, or any other prescription from our practice, on "Automatic Refill". Patient may have prescription filled one day early if pharmacy is closed on scheduled refill date. Do not fill until: 09/26/15 To last until: 10/26/15  . pregabalin (LYRICA) 100 MG capsule    Sig: Take 1 capsule (100 mg total) by mouth 3 (three) times daily.    Dispense:  90 capsule    Refill:  1    Do not place this medication, or any other prescription from our practice, on "Automatic Refill". Patient may have prescription filled one day early if pharmacy is closed on scheduled refill date.   Medications administered during this visit: Ethleen W. Neace had no medications administered during this visit.   Interventional management options: Colleen Mejia was informed that there is no guarantee that she would be a candidate for interventional therapies. The decision will be based on the results of diagnostic studies, as well as Colleen Mejia's risk profile.  Procedure(s) under consideration:  Left cervical ESI, history of C6-C7 ACDF Lumbar ESI for right lumbar radicular symptoms   Provider-requested  follow-up: Return in about 2 weeks (around 02/16/2019) for Procedure.  Future Appointments  Date Time Provider [place]  02/22/2019  9:30 AM Gillis Santa, MD [facility] None    Primary Care Physician: Tracie Harrier, MD Location: [facility] Note by: Gillis Santa, M.D, Date:  02/02/2019; Time: 9:54 AM  Patient Instructions  1. Cervical ESI with sedation 2. Recommend TENs unit- discuss with PT (or you can get from [facility] or [facility]) 3. Rx for Lyrica- continue med management with PCP____________________________________________________________________________________________  General Risks and Possible Complications  Patient Responsibilities: It is important that you read this as it is part of your informed consent. It is our duty to inform you of the risks and possible complications associated with treatments offered to you. It is your responsibility as a patient to read this and to ask questions about anything that is not clear or that you believe was not covered in this document.  Patient's Rights: You have the right to refuse treatment. You also have the right to change your mind, even after initially having agreed to have the treatment done. However, under this last option, if you wait until the last second to change your mind, you may be charged for the materials used up to that point.  Introduction: Medicine is not an Chief Strategy Officer. Everything in Medicine, including the lack of treatment(s), carries the potential for danger, harm, or loss (which is by definition: Risk). In Medicine, a complication is a secondary problem, condition, or disease that can aggravate an already existing one. All treatments carry the risk of possible complications. The fact that a side effects or complications occurs, does not imply that the treatment was conducted incorrectly. It must be clearly understood that these can happen even when everything is done following the highest safety standards.  No treatment: You can choose not to proceed with the proposed treatment alternative. The "PRO(s)" would include: avoiding the risk of complications associated with the therapy. The "CON(s)" would include: not getting any of the treatment benefits. These benefits fall under one of three categories:  diagnostic; therapeutic; and/or palliative. Diagnostic benefits include: getting information which can ultimately lead to improvement of the disease or symptom(s). Therapeutic benefits are those associated with the successful treatment of the disease. Finally, palliative benefits are those related to the decrease of the primary symptoms, without necessarily curing the condition (example: decreasing the pain from a flare-up of a chronic condition, such as incurable terminal cancer).  General Risks and Complications: These are associated to most interventional treatments. They can occur alone, or in combination. They fall under one of the following six (6) categories: no benefit or worsening of symptoms; bleeding; infection; nerve damage; allergic reactions; and/or death. 1. No benefits or worsening of symptoms: In Medicine there are no guarantees, only probabilities. No healthcare provider can ever guarantee that a medical treatment will work, they can only state the probability that it may. Furthermore, there is always the possibility that the condition may worsen, either directly, or indirectly, as a consequence of the treatment. 2. Bleeding: This is more common if the patient is taking a blood thinner, either prescription or over the counter (example: Goody Powders, Fish oil, Aspirin, Garlic, etc.), or if suffering a condition associated with impaired coagulation (example: Hemophilia, cirrhosis of the liver, low platelet counts, etc.). However, even if you do not have one on these, it can still happen. If you have any of these conditions, or take one of these drugs, make sure to  notify your treating physician. 3. Infection: This is more common in patients with a compromised immune system, either due to disease (example: diabetes, cancer, human immunodeficiency virus [HIV], etc.), or due to medications or treatments (example: therapies used to treat cancer and rheumatological diseases). However, even if you do  not have one on these, it can still happen. If you have any of these conditions, or take one of these drugs, make sure to notify your treating physician. 4. Nerve Damage: This is more common when the treatment is an invasive one, but it can also happen with the use of medications, such as those used in the treatment of cancer. The damage can occur to small secondary nerves, or to large primary ones, such as those in the spinal cord and brain. This damage may be temporary or permanent and it may lead to impairments that can range from temporary numbness to permanent paralysis and/or brain death. 5. Allergic Reactions: Any time a substance or material comes in contact with our body, there is the possibility of an allergic reaction. These can range from a mild skin rash (contact dermatitis) to a severe systemic reaction (anaphylactic reaction), which can result in death. 6. Death: In general, any medical intervention can result in death, most of the time due to an unforeseen complication. ____________________________________________________________________________________________  ____________________________________________________________________________________________  Preparing for Procedure with Sedation  Procedure appointments are limited to planned procedures: . No Prescription Refills. . No disability issues will be discussed. . No medication changes will be discussed.  Instructions: . Oral Intake: Do not eat or drink anything for at least 8 hours prior to your procedure. . Transportation: Public transportation is not allowed. Bring an adult driver. The driver must be physically present in our waiting room before any procedure can be started. Marland Kitchen Physical Assistance: Bring an adult physically capable of assisting you, in the event you need help. This adult should keep you company at home for at least 6 hours after the procedure. . Blood Pressure Medicine: Take your blood pressure medicine with  a sip of water the morning of the procedure. . Blood thinners: Notify our staff if you are taking any blood thinners. Depending on which one you take, there will be specific instructions on how and when to stop it. . Diabetics on insulin: Notify the staff so that you can be scheduled 1st case in the morning. If your diabetes requires high dose insulin, take only  of your normal insulin dose the morning of the procedure and notify the staff that you have done so. . Preventing infections: Shower with an antibacterial soap the morning of your procedure. . Build-up your immune system: Take 1000 mg of Vitamin C with every meal (3 times a day) the day prior to your procedure. Marland Kitchen Antibiotics: Inform the staff if you have a condition or reason that requires you to take antibiotics before dental procedures. . Pregnancy: If you are pregnant, call and cancel the procedure. . Sickness: If you have a cold, fever, or any active infections, call and cancel the procedure. . Arrival: You must be in the facility at least 30 minutes prior to your scheduled procedure. . Children: Do not bring children with you. . Dress appropriately: Bring dark clothing that you would not mind if they get stained. . Valuables: Do not bring any jewelry or valuables.  Reasons to call and reschedule or cancel your procedure: (Following these recommendations will minimize the risk of a serious complication.) . Surgeries: Avoid having procedures within 2  weeks of any surgery. (Avoid for 2 weeks before or after any surgery). . Flu Shots: Avoid having procedures within 2 weeks of a flu shots or . (Avoid for 2 weeks before or after immunizations). . Barium: Avoid having a procedure within 7-10 days after having had a radiological study involving the use of radiological contrast. (Myelograms, Barium swallow or enema study). . Heart attacks: Avoid any elective procedures or surgeries for the initial 6 months after a "Myocardial Infarction"  (Heart Attack). . Blood thinners: It is imperative that you stop these medications before procedures. Let us know if you if you take any blood thinner.  . Infection: Avoid procedures during or within two weeks of an infection (including chest colds or gastrointestinal problems). Symptoms associated with infections include: Localized redness, fever, chills, night sweats or profuse sweating, burning sensation when voiding, cough, congestion, stuffiness, runny nose, sore throat, diarrhea, nausea, vomiting, cold or Flu symptoms, recent or current infections. It is specially important if the infection is over the area that we intend to treat. Marland Kitchen Heart and lung problems: Symptoms that may suggest an active cardiopulmonary problem include: cough, chest pain, breathing difficulties or shortness of breath, dizziness, ankle swelling, uncontrolled high or unusually low blood pressure, and/or palpitations. If you are experiencing any of these symptoms, cancel your procedure and contact your primary care physician for an evaluation.  Remember:  Regular Business hours are:  Monday to Thursday 8:00 AM to 4:00 PM  Provider's Schedule: Milinda Pointer, MD:  Procedure days: Tuesday and Thursday 7:30 AM to 4:00 PM  Gillis Santa, MD:  Procedure days: Monday and Wednesday 7:30 AM to 4:00 PM ____________________________________________________________________________________________  Epidural Steroid Injection Patient Information  Description: The epidural space surrounds the nerves as they exit the spinal cord.  In some patients, the nerves can be compressed and inflamed by a bulging disc or a tight spinal canal (spinal stenosis).  By injecting steroids into the epidural space, we can bring irritated nerves into direct contact with a potentially helpful medication.  These steroids act directly on the irritated nerves and can reduce swelling and inflammation which often leads to decreased pain.  Epidural steroids may  be injected anywhere along the spine and from the neck to the low back depending upon the location of your pain.   After numbing the skin with local anesthetic (like Novocaine), a small needle is passed into the epidural space slowly.  You may experience a sensation of pressure while this is being done.  The entire block usually last less than 10 minutes.  Conditions which may be treated by epidural steroids:   Low back and leg pain  Neck and arm pain  Spinal stenosis  Post-laminectomy syndrome  Herpes zoster (shingles) pain  Pain from compression fractures  Preparation for the injection:  1. Do not eat any solid food or dairy products within 8 hours of your appointment.  2. You may drink clear liquids up to 3 hours before appointment.  Clear liquids include water, black coffee, juice or soda.  No milk or cream please. 3. You may take your regular medication, including pain medications, with a sip of water before your appointment  Diabetics should hold regular insulin (if taken separately) and take 1/2 normal NPH dos the morning of the procedure.  Carry some sugar containing items with you to your appointment. 4. A driver must accompany you and be prepared to drive you home after your procedure.  5. Bring all your current medications with your. 6.  An IV may be inserted and sedation may be given at the discretion of the physician.   7. A blood pressure cuff, EKG and other monitors will often be applied during the procedure.  Some patients may need to have extra oxygen administered for a short period. 8. You will be asked to provide medical information, including your allergies, prior to the procedure.  We must know immediately if you are taking blood thinners (like Coumadin/Warfarin)  Or if you are allergic to IV iodine contrast (dye). We must know if you could possible be pregnant.  Possible side-effects:  Bleeding from needle site  Infection (rare, may require surgery)  Nerve  injury (rare)  Numbness & tingling (temporary)  Difficulty urinating (rare, temporary)  Spinal headache ( a headache worse with upright posture)  Light -headedness (temporary)  Pain at injection site (several days)  Decreased blood pressure (temporary)  Weakness in arm/leg (temporary)  Pressure sensation in back/neck (temporary)  Call if you experience:  Fever/chills associated with headache or increased back/neck pain.  Headache worsened by an upright position.  New onset weakness or numbness of an extremity below the injection site  Hives or difficulty breathing (go to the emergency room)  Inflammation or drainage at the infection site  Severe back/neck pain  Any new symptoms which are concerning to you  Please note:  Although the local anesthetic injected can often make your back or neck feel good for several hours after the injection, the pain will likely return.  It takes 3-7 days for steroids to work in the epidural space.  You may not notice any pain relief for at least that one week.  If effective, we will often do a series of three injections spaced 3-6 weeks apart to maximally decrease your pain.  After the initial series, we generally will wait several months before considering a repeat injection of the same type.  If you have any questions, please call [phone] [facility]

## 2019-02-02 NOTE — Progress Notes (Signed)
Safety precautions to be maintained throughout the outpatient stay will include: orient to surroundings, keep bed in low position, maintain call bell within reach at all times, provide assistance with transfer out of bed and ambulation.  

## 2019-02-02 NOTE — Patient Instructions (Addendum)
1. Cervical ESI with sedation 2. Recommend TENs unit- discuss with PT (or you can get from [facility] or [facility]) 3. Rx for Lyrica- continue med management with PCP____________________________________________________________________________________________  General Risks and Possible Complications  Patient Responsibilities: It is important that you read this as it is part of your informed consent. It is our duty to inform you of the risks and possible complications associated with treatments offered to you. It is your responsibility as a patient to read this and to ask questions about anything that is not clear or that you believe was not covered in this document.  Patient's Rights: You have the right to refuse treatment. You also have the right to change your mind, even after initially having agreed to have the treatment done. However, under this last option, if you wait until the last second to change your mind, you may be charged for the materials used up to that point.  Introduction: Medicine is not an Chief Strategy Officer. Everything in Medicine, including the lack of treatment(s), carries the potential for danger, harm, or loss (which is by definition: Risk). In Medicine, a complication is a secondary problem, condition, or disease that can aggravate an already existing one. All treatments carry the risk of possible complications. The fact that a side effects or complications occurs, does not imply that the treatment was conducted incorrectly. It must be clearly understood that these can happen even when everything is done following the highest safety standards.  No treatment: You can choose not to proceed with the proposed treatment alternative. The "PRO(s)" would include: avoiding the risk of complications associated with the therapy. The "CON(s)" would include: not getting any of the treatment benefits. These benefits fall under one of three categories: diagnostic; therapeutic; and/or palliative.  Diagnostic benefits include: getting information which can ultimately lead to improvement of the disease or symptom(s). Therapeutic benefits are those associated with the successful treatment of the disease. Finally, palliative benefits are those related to the decrease of the primary symptoms, without necessarily curing the condition (example: decreasing the pain from a flare-up of a chronic condition, such as incurable terminal cancer).  General Risks and Complications: These are associated to most interventional treatments. They can occur alone, or in combination. They fall under one of the following six (6) categories: no benefit or worsening of symptoms; bleeding; infection; nerve damage; allergic reactions; and/or death. 1. No benefits or worsening of symptoms: In Medicine there are no guarantees, only probabilities. No healthcare provider can ever guarantee that a medical treatment will work, they can only state the probability that it may. Furthermore, there is always the possibility that the condition may worsen, either directly, or indirectly, as a consequence of the treatment. 2. Bleeding: This is more common if the patient is taking a blood thinner, either prescription or over the counter (example: Goody Powders, Fish oil, Aspirin, Garlic, etc.), or if suffering a condition associated with impaired coagulation (example: Hemophilia, cirrhosis of the liver, low platelet counts, etc.). However, even if you do not have one on these, it can still happen. If you have any of these conditions, or take one of these drugs, make sure to notify your treating physician. 3. Infection: This is more common in patients with a compromised immune system, either due to disease (example: diabetes, cancer, human immunodeficiency virus [HIV], etc.), or due to medications or treatments (example: therapies used to treat cancer and rheumatological diseases). However, even if you do not have one on these, it can still happen.  If you have any of these conditions, or take one of these drugs, make sure to notify your treating physician. 4. Nerve Damage: This is more common when the treatment is an invasive one, but it can also happen with the use of medications, such as those used in the treatment of cancer. The damage can occur to small secondary nerves, or to large primary ones, such as those in the spinal cord and brain. This damage may be temporary or permanent and it may lead to impairments that can range from temporary numbness to permanent paralysis and/or brain death. 5. Allergic Reactions: Any time a substance or material comes in contact with our body, there is the possibility of an allergic reaction. These can range from a mild skin rash (contact dermatitis) to a severe systemic reaction (anaphylactic reaction), which can result in death. 6. Death: In general, any medical intervention can result in death, most of the time due to an unforeseen complication. ____________________________________________________________________________________________  ____________________________________________________________________________________________  Preparing for Procedure with Sedation  Procedure appointments are limited to planned procedures: . No Prescription Refills. . No disability issues will be discussed. . No medication changes will be discussed.  Instructions: . Oral Intake: Do not eat or drink anything for at least 8 hours prior to your procedure. . Transportation: Public transportation is not allowed. Bring an adult driver. The driver must be physically present in our waiting room before any procedure can be started. Marland Kitchen Physical Assistance: Bring an adult physically capable of assisting you, in the event you need help. This adult should keep you company at home for at least 6 hours after the procedure. . Blood Pressure Medicine: Take your blood pressure medicine with a sip of water the morning of the  procedure. . Blood thinners: Notify our staff if you are taking any blood thinners. Depending on which one you take, there will be specific instructions on how and when to stop it. . Diabetics on insulin: Notify the staff so that you can be scheduled 1st case in the morning. If your diabetes requires high dose insulin, take only  of your normal insulin dose the morning of the procedure and notify the staff that you have done so. . Preventing infections: Shower with an antibacterial soap the morning of your procedure. . Build-up your immune system: Take 1000 mg of Vitamin C with every meal (3 times a day) the day prior to your procedure. Marland Kitchen Antibiotics: Inform the staff if you have a condition or reason that requires you to take antibiotics before dental procedures. . Pregnancy: If you are pregnant, call and cancel the procedure. . Sickness: If you have a cold, fever, or any active infections, call and cancel the procedure. . Arrival: You must be in the facility at least 30 minutes prior to your scheduled procedure. . Children: Do not bring children with you. . Dress appropriately: Bring dark clothing that you would not mind if they get stained. . Valuables: Do not bring any jewelry or valuables.  Reasons to call and reschedule or cancel your procedure: (Following these recommendations will minimize the risk of a serious complication.) . Surgeries: Avoid having procedures within 2 weeks of any surgery. (Avoid for 2 weeks before or after any surgery). . Flu Shots: Avoid having procedures within 2 weeks of a flu shots or . (Avoid for 2 weeks before or after immunizations). . Barium: Avoid having a procedure within 7-10 days after having had a radiological study involving the use of radiological contrast. (Myelograms, Barium  swallow or enema study). . Heart attacks: Avoid any elective procedures or surgeries for the initial 6 months after a "Myocardial Infarction" (Heart Attack). . Blood thinners: It  is imperative that you stop these medications before procedures. Let us know if you if you take any blood thinner.  . Infection: Avoid procedures during or within two weeks of an infection (including chest colds or gastrointestinal problems). Symptoms associated with infections include: Localized redness, fever, chills, night sweats or profuse sweating, burning sensation when voiding, cough, congestion, stuffiness, runny nose, sore throat, diarrhea, nausea, vomiting, cold or Flu symptoms, recent or current infections. It is specially important if the infection is over the area that we intend to treat. Marland Kitchen Heart and lung problems: Symptoms that may suggest an active cardiopulmonary problem include: cough, chest pain, breathing difficulties or shortness of breath, dizziness, ankle swelling, uncontrolled high or unusually low blood pressure, and/or palpitations. If you are experiencing any of these symptoms, cancel your procedure and contact your primary care physician for an evaluation.  Remember:  Regular Business hours are:  Monday to Thursday 8:00 AM to 4:00 PM  Provider's Schedule: Milinda Pointer, MD:  Procedure days: Tuesday and Thursday 7:30 AM to 4:00 PM  Gillis Santa, MD:  Procedure days: Monday and Wednesday 7:30 AM to 4:00 PM ____________________________________________________________________________________________  Epidural Steroid Injection Patient Information  Description: The epidural space surrounds the nerves as they exit the spinal cord.  In some patients, the nerves can be compressed and inflamed by a bulging disc or a tight spinal canal (spinal stenosis).  By injecting steroids into the epidural space, we can bring irritated nerves into direct contact with a potentially helpful medication.  These steroids act directly on the irritated nerves and can reduce swelling and inflammation which often leads to decreased pain.  Epidural steroids may be injected anywhere along the spine  and from the neck to the low back depending upon the location of your pain.   After numbing the skin with local anesthetic (like Novocaine), a small needle is passed into the epidural space slowly.  You may experience a sensation of pressure while this is being done.  The entire block usually last less than 10 minutes.  Conditions which may be treated by epidural steroids:   Low back and leg pain  Neck and arm pain  Spinal stenosis  Post-laminectomy syndrome  Herpes zoster (shingles) pain  Pain from compression fractures  Preparation for the injection:  1. Do not eat any solid food or dairy products within 8 hours of your appointment.  2. You may drink clear liquids up to 3 hours before appointment.  Clear liquids include water, black coffee, juice or soda.  No milk or cream please. 3. You may take your regular medication, including pain medications, with a sip of water before your appointment  Diabetics should hold regular insulin (if taken separately) and take 1/2 normal NPH dos the morning of the procedure.  Carry some sugar containing items with you to your appointment. 4. A driver must accompany you and be prepared to drive you home after your procedure.  5. Bring all your current medications with your. 6. An IV may be inserted and sedation may be given at the discretion of the physician.   7. A blood pressure cuff, EKG and other monitors will often be applied during the procedure.  Some patients may need to have extra oxygen administered for a short period. 8. You will be asked to provide medical information, including your  allergies, prior to the procedure.  We must know immediately if you are taking blood thinners (like Coumadin/Warfarin)  Or if you are allergic to IV iodine contrast (dye). We must know if you could possible be pregnant.  Possible side-effects:  Bleeding from needle site  Infection (rare, may require surgery)  Nerve injury (rare)  Numbness & tingling  (temporary)  Difficulty urinating (rare, temporary)  Spinal headache ( a headache worse with upright posture)  Light -headedness (temporary)  Pain at injection site (several days)  Decreased blood pressure (temporary)  Weakness in arm/leg (temporary)  Pressure sensation in back/neck (temporary)  Call if you experience:  Fever/chills associated with headache or increased back/neck pain.  Headache worsened by an upright position.  New onset weakness or numbness of an extremity below the injection site  Hives or difficulty breathing (go to the emergency room)  Inflammation or drainage at the infection site  Severe back/neck pain  Any new symptoms which are concerning to you  Please note:  Although the local anesthetic injected can often make your back or neck feel good for several hours after the injection, the pain will likely return.  It takes 3-7 days for steroids to work in the epidural space.  You may not notice any pain relief for at least that one week.  If effective, we will often do a series of three injections spaced 3-6 weeks apart to maximally decrease your pain.  After the initial series, we generally will wait several months before considering a repeat injection of the same type.  If you have any questions, please call [phone] [facility]

## 2019-02-08 LAB — COMPLIANCE DRUG ANALYSIS, UR

## 2019-02-22 ENCOUNTER — Ambulatory Visit: Payer: Medicare Other | Admitting: Student in an Organized Health Care Education/Training Program

## 2019-03-09 ENCOUNTER — Encounter: Payer: Self-pay | Admitting: Student in an Organized Health Care Education/Training Program

## 2019-03-10 ENCOUNTER — Ambulatory Visit
Payer: Medicare Other | Attending: Student in an Organized Health Care Education/Training Program | Admitting: Student in an Organized Health Care Education/Training Program

## 2019-03-10 ENCOUNTER — Other Ambulatory Visit: Payer: Self-pay

## 2019-03-10 DIAGNOSIS — F1490 Cocaine use, unspecified, uncomplicated: Secondary | ICD-10-CM

## 2019-03-10 DIAGNOSIS — M961 Postlaminectomy syndrome, not elsewhere classified: Secondary | ICD-10-CM

## 2019-03-10 DIAGNOSIS — M47812 Spondylosis without myelopathy or radiculopathy, cervical region: Secondary | ICD-10-CM

## 2019-03-10 DIAGNOSIS — Z8659 Personal history of other mental and behavioral disorders: Secondary | ICD-10-CM

## 2019-03-10 DIAGNOSIS — G894 Chronic pain syndrome: Secondary | ICD-10-CM | POA: Diagnosis not present

## 2019-03-10 DIAGNOSIS — M4802 Spinal stenosis, cervical region: Secondary | ICD-10-CM

## 2019-03-10 DIAGNOSIS — M47816 Spondylosis without myelopathy or radiculopathy, lumbar region: Secondary | ICD-10-CM

## 2019-03-10 NOTE — Progress Notes (Signed)
Pain Management Virtual Encounter Note - Virtual Visit via Telephone Telehealth (real-time audio visits between healthcare provider and patient).  Patient's Phone No. & Preferred Pharmacy:  [phone] (home); [phone] (mobile); (Preferred) [phone] No e-mail address on record  [place], [place] - [address] [place] [address] Phone: [phone] Fax: [phone]  [id number] - , [place] - [address] [address] Phone: [phone] Fax: [phone]   Pre-screening note:  Our staff contacted Ms. Dantuono and offered her an "in person", "face-to-face" appointment versus a telephone encounter. She indicated preferring the telephone encounter, at this time.  Reason for Virtual Visit: COVID-19*  Social distancing based on CDC and AMA recommendations.   I contacted Jade Burkard Alderman on 03/10/2019 at 9:42 AM via telephone and clearly identified myself as Gillis Santa, MD. I verified that I was speaking with the correct person using two identifiers (Name and date of birth: Feb 06, 1971).  Advanced Informed Consent I sought verbal advanced consent from [place] for virtual visit interactions. I informed Ms. Ruda of possible security and privacy concerns, risks, and limitations associated with providing "not-in-person" medical evaluation and management services. I also informed Ms. Borjon of the availability of "in-person" appointments. Finally, I informed her that there would be a charge for the virtual visit and that she could be  personally, fully or partially, financially responsible for it. Ms. Berk expressed understanding and agreed to proceed.   Historic Elements   Ms. PAT ELICKER is a 48 y.o. year old, female patient evaluated today after her last encounter by our practice on 02/02/2019. Ms. Bodner  has a past medical history of Anemia, Asthma, Chronic back pain, Complication of  anesthesia, Diabetes mellitus without complication ([place]), GERD (gastroesophageal reflux disease), Hypertension, Osteoarthritis, and Right arm numbness. She also  has a past surgical history that includes tummy tuck; Neck surgery; Arm wound repair / closure (Right); and Shoulder arthroscopy with open rotator cuff repair (Right, 11/21/2017). Ms. Angelo has a current medication list which includes the following prescription(s): atorvastatin, cetirizine, duloxetine, glimepiride, loratadine, losartan, pregabalin, quetiapine, tizanidine, albuterol, amphetamine-dextroamphetamine, esomeprazole, ipratropium-albuterol, methylprednisolone, montelukast, and tizanidine. She  reports that she has been smoking cigarettes. She has a 10.00 pack-year smoking history. She has never used smokeless tobacco. She reports that she does not drink alcohol or use drugs. Ms. Criss has No Known Allergies.   HPI  I last communicated with her on 02/02/2019. Today, she is being contacted for worsening of previously known (established) problem     Please see detailed first clinic note.  I have only seen this patient on 1 prior occasion at the end of March.  Patient does have a C5-C6 disc protrusion resulted in cervical or radicular symptoms worse on the left.  She has a history of C6-C7 ACDF.  Prior history of cocaine abuse. Clearly told her she would only be candidate for interventional therapy.  This is been detailed in my first clinic note and at that visit, I informed the patient that we will focus primarily on interventional pain management.  I did prescribe her Lyrica 100 mg 3 times a day at that visit which she does find some benefit from and I encouraged her to continue that.  As soon as we get clearance to resume procedures, we will contact the patient for repeat cervical ESI.    Review of recent tests  MR FOOT LEFT W WO CONTRAST CLINICAL DATA:  Stepped on a piece of broken base. Scar tissue possibly along the plantar aspect  of the right foot.  EXAM: MRI OF THE LEFT FOREFOOT WITHOUT AND WITH CONTRAST  TECHNIQUE: Multiplanar, multisequence MR imaging of the left foot was performed both before and after administration of intravenous contrast.  CONTRAST:  9 mL Gadavist  COMPARISON:  None.  FINDINGS: TENDONS  Peroneal: Peroneal longus tendon intact. Peroneal brevis intact.  Posteromedial: Posterior tibial tendon intact. Flexor hallucis longus tendon intact. Flexor digitorum longus tendon intact.  Anterior: Tibialis anterior tendon intact. Extensor hallucis longus tendon intact Extensor digitorum longus tendon intact.  Achilles:  Intact.  Plantar Fascia: Plantar fascia is intact. 2.7 x 2.1 x 1 cm T1 intermediate signal and T2 intermediate signal, enhancing soft tissue mass along the plantar aspect of the medial band of the plantar fascia at the level of first TMT joint most concerning for focal area of scarring given the patient's history of injury versus a plantar fibroma.  LIGAMENTS  Lateral: Anterior talofibular ligament intact. Calcaneofibular ligament intact. Posterior talofibular ligament intact. Anterior and posterior tibiofibular ligaments intact.  Medial: Deltoid ligament intact. Spring ligament intact.  CARTILAGE  Ankle Joint: No joint effusion. Normal ankle mortise. 4 mm osteochondral lesion involving the posteromedial talar dome with mild subchondral marrow edema.  Subtalar Joints/Sinus Tarsi: Normal subtalar joints. No subtalar joint effusion. Normal sinus tarsi.  Bones: No marrow signal abnormality.  No fracture or dislocation.  Soft Tissue: No fluid collection or hematoma. No muscle abnormality.  IMPRESSION: 1. 2.7 x 2.1 x 1 cm soft tissue mass along the plantar aspect of the medial band of the plantar fascia at the level of first TMT joint most concerning for focal area of scarring given the patient's history of injury versus a plantar fibroma.  Electronically  Signed   By: Kathreen Devoid   On: 01/29/2019 15:59   Office Visit on 02/02/2019  Component Date Value Ref Range Status  . Summary 02/04/2019 FINAL   Final   Comment: ==================================================================== TOXASSURE COMP DRUG ANALYSIS,UR ==================================================================== Test                             Result       Flag       Units Drug Present and Declared for Prescription Verification   Pregabalin                     PRESENT      EXPECTED   Duloxetine                     PRESENT      EXPECTED   Quetiapine                     PRESENT      EXPECTED Drug Present not Declared for Prescription Verification   Amitriptyline                  PRESENT      UNEXPECTED   Nortriptyline                  PRESENT      UNEXPECTED    Nortriptyline may be administered as a prescription drug; it is    also an expected metabolite of amitriptyline.   Diphenhydramine                PRESENT      UNEXPECTED  Drug Absent but Declared for Prescription Verification   Amphetamine                    Not Detected UNEXPECTED ng/mg creat   Tramadol                       Not Detected UNEXPECTED ng/mg                           creat   Tizanidine                     Not Detected UNEXPECTED    Tizanidine, as indicated in the declared medication list, is not    always detected even when used as directed. ==================================================================== Test                      Result    Flag   Units      Ref Range   Creatinine              29               mg/dL      >=20 ==================================================================== Declared Medications:  The flagging and interpretation on this report are based on the  following declared medications.  Unexpected results may arise from  inaccuracies in the declared medications.  **Note: The testing scope of this panel includes these medications:  Amphetamine (Adderall)   Duloxetine  Pregabalin (Lyrica)  Quetiapine (Seroquel)  Tramadol  **Note: The testing scope of this panel does not include small to  moderate amounts of these reported medications:  Tizanidine  **Note: The testing scope of this panel does not inclu                          de following  reported medications:  Albuterol  Albuterol (Duoneb)  Atorvastatin  Cetirizine  Glimepiride (Amaryl)  Ipratropium (Duoneb)  Loratadine  Losartan (Cozaar)  Methylprednisolone (Medrol)  Montelukast  Omeprazole (Nexium) ==================================================================== For clinical consultation, please call [phone]. ====================================================================    Assessment  The primary encounter diagnosis was Chronic pain syndrome. 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  Please see detailed first clinic note.  I have only seen this patient on 1 prior occasion at the end of March.  Patient does have a C5-C6 disc protrusion resulted in cervical or radicular symptoms worse on the left.  She has a history of C6-C7 ACDF.  Prior history of cocaine abuse. Clearly told her she would only be candidate for interventional therapy.  This is been detailed in my first clinic note and at that visit, I informed the patient that we will focus primarily on interventional pain management.  I did prescribe her Lyrica 100 mg 3 times a day at that visit which she does find some benefit from and I encouraged her to continue that.  As soon as we get clearance to resume procedures, we will contact the patient for repeat cervical ESI.     Plan of Care  I have discontinued Velma W. Lesniewski's traMADol. I am also having her maintain her QUEtiapine,  losartan, albuterol, esomeprazole, ipratropium-albuterol, Loratadine, montelukast, tiZANidine, amphetamine-dextroamphetamine, atorvastatin,  cetirizine, DULoxetine, glimepiride, methylPREDNISolone, tiZANidine, and pregabalin.  I informed the patient that we will contact her once we have clearance to resume procedures and at that time we can proceed with cervical epidural steroid injection as we have discussed in the past.  Patient endorsed understanding.  I informed the patient that medication management should continue with primary care provider as I do not intend on prescribing any controlled substances given her prior history of cocaine abuse.  Follow-up plan:   Return for Procedure (C-ESI).   I discussed the assessment and treatment plan with the patient. The patient was provided an opportunity to ask questions and all were answered. The patient agreed with the plan and demonstrated an understanding of the instructions.  Patient advised to call back or seek an in-person evaluation if the symptoms or condition worsens.  Total duration of non-face-to-face encounter: 15 minutes.

## 2019-03-30 ENCOUNTER — Telehealth: Payer: Self-pay | Admitting: Student in an Organized Health Care Education/Training Program

## 2019-03-30 ENCOUNTER — Telehealth: Payer: Self-pay

## 2019-03-30 NOTE — Telephone Encounter (Signed)
Just an FYI: Patient called in and states she wants no further appointments with [place]. I put a flag in her chart.

## 2019-03-30 NOTE — Telephone Encounter (Signed)
Pt called stating that her pain is severe and she doesn't know what else to do. She was seen via VV on May6th for this exact same thing and Dr Holley Raring stated for her to come in for a procedure but we are still not doing these. She is requesting medication which is also what she wanted at the last VV. I didn't know if I should just put her on for another VV or what.

## 2019-03-30 NOTE — Telephone Encounter (Signed)
Called patient, let her know that I have reviewed last visit and that Dr Holley Raring isn't planning on prescribing medications for her, controlled medications and would leave that up to her PCP.  Told patient that as soon as we are able to begin procedures again we would be in contact to get her in for ordered CESI.  Patient verbalizes u/o information.

## 2019-04-06 ENCOUNTER — Other Ambulatory Visit: Payer: Self-pay | Admitting: Internal Medicine

## 2019-04-06 DIAGNOSIS — Z1231 Encounter for screening mammogram for malignant neoplasm of breast: Secondary | ICD-10-CM

## 2019-04-27 ENCOUNTER — Other Ambulatory Visit: Payer: Self-pay | Admitting: Gastroenterology

## 2019-04-27 DIAGNOSIS — R1310 Dysphagia, unspecified: Secondary | ICD-10-CM

## 2019-04-30 ENCOUNTER — Other Ambulatory Visit: Payer: Self-pay

## 2019-04-30 ENCOUNTER — Ambulatory Visit
Admission: RE | Admit: 2019-04-30 | Discharge: 2019-04-30 | Disposition: A | Discharge status: Home / Self Care | Payer: Medicare Other | Source: Ambulatory Visit | Attending: Gastroenterology | Admitting: Gastroenterology

## 2019-04-30 DIAGNOSIS — R1310 Dysphagia, unspecified: Secondary | ICD-10-CM | POA: Diagnosis present

## 2019-05-07 ENCOUNTER — Encounter
Admission: RE | Admit: 2019-05-07 | Discharge: 2019-05-07 | Disposition: A | Discharge status: Home / Self Care | Payer: Medicare Other | Source: Ambulatory Visit | Attending: Gastroenterology | Admitting: Gastroenterology

## 2019-05-17 ENCOUNTER — Other Ambulatory Visit: Admission: RE | Admit: 2019-05-17 | Payer: Medicare Other | Source: Ambulatory Visit

## 2019-05-20 ENCOUNTER — Ambulatory Visit: Admission: RE | Admit: 2019-05-20 | Payer: Medicare Other | Source: Home / Self Care | Admitting: Gastroenterology

## 2019-05-20 ENCOUNTER — Encounter: Admission: RE | Payer: Self-pay | Source: Home / Self Care

## 2019-05-20 SURGERY — ESOPHAGOGASTRODUODENOSCOPY (EGD) WITH PROPOFOL
Anesthesia: General

## 2019-05-26 ENCOUNTER — Encounter: Admission: RE | Admit: 2019-05-26 | Payer: Medicare Other | Source: Ambulatory Visit

## 2019-05-27 ENCOUNTER — Other Ambulatory Visit: Payer: Medicare Other | Attending: Gastroenterology

## 2019-05-31 ENCOUNTER — Ambulatory Visit: Admission: RE | Admit: 2019-05-31 | Payer: Medicare Other | Source: Home / Self Care | Admitting: Gastroenterology

## 2019-05-31 ENCOUNTER — Encounter: Admission: RE | Payer: Self-pay | Source: Home / Self Care

## 2019-05-31 SURGERY — ESOPHAGOGASTRODUODENOSCOPY (EGD) WITH PROPOFOL
Anesthesia: General

## 2019-07-27 ENCOUNTER — Emergency Department: Payer: Medicare Other

## 2019-07-27 ENCOUNTER — Encounter: Payer: Self-pay | Admitting: Emergency Medicine

## 2019-07-27 ENCOUNTER — Other Ambulatory Visit: Payer: Self-pay

## 2019-07-27 ENCOUNTER — Emergency Department
Admission: EM | Admit: 2019-07-27 | Discharge: 2019-07-27 | Disposition: A | Discharge status: Home / Self Care | Payer: Medicare Other | Attending: Emergency Medicine | Admitting: Emergency Medicine

## 2019-07-27 DIAGNOSIS — Z7984 Long term (current) use of oral hypoglycemic drugs: Secondary | ICD-10-CM | POA: Diagnosis not present

## 2019-07-27 DIAGNOSIS — F17210 Nicotine dependence, cigarettes, uncomplicated: Secondary | ICD-10-CM | POA: Insufficient documentation

## 2019-07-27 DIAGNOSIS — Z79899 Other long term (current) drug therapy: Secondary | ICD-10-CM | POA: Insufficient documentation

## 2019-07-27 DIAGNOSIS — K5792 Diverticulitis of intestine, part unspecified, without perforation or abscess without bleeding: Secondary | ICD-10-CM

## 2019-07-27 DIAGNOSIS — E119 Type 2 diabetes mellitus without complications: Secondary | ICD-10-CM | POA: Insufficient documentation

## 2019-07-27 DIAGNOSIS — J45909 Unspecified asthma, uncomplicated: Secondary | ICD-10-CM | POA: Insufficient documentation

## 2019-07-27 DIAGNOSIS — E039 Hypothyroidism, unspecified: Secondary | ICD-10-CM | POA: Diagnosis not present

## 2019-07-27 DIAGNOSIS — I10 Essential (primary) hypertension: Secondary | ICD-10-CM | POA: Diagnosis not present

## 2019-07-27 DIAGNOSIS — K625 Hemorrhage of anus and rectum: Secondary | ICD-10-CM | POA: Diagnosis present

## 2019-07-27 DIAGNOSIS — R5383 Other fatigue: Secondary | ICD-10-CM | POA: Insufficient documentation

## 2019-07-27 DIAGNOSIS — R110 Nausea: Secondary | ICD-10-CM | POA: Diagnosis not present

## 2019-07-27 LAB — COMPREHENSIVE METABOLIC PANEL
ALT: 44 U/L (ref 0–44)
AST: 38 U/L (ref 15–41)
Albumin: 4.1 g/dL (ref 3.5–5.0)
Alkaline Phosphatase: 59 U/L (ref 38–126)
Anion gap: 15 (ref 5–15)
BUN: 20 mg/dL (ref 6–20)
CO2: 22 mmol/L (ref 22–32)
Calcium: 9.4 mg/dL (ref 8.9–10.3)
Chloride: 101 mmol/L (ref 98–111)
Creatinine, Ser: 0.84 mg/dL (ref 0.44–1.00)
GFR calc Af Amer: >60 mL/min (ref >60)
GFR calc non Af Amer: >60 mL/min (ref >60)
Glucose, Bld: 247 mg/dL — ABNORMAL HIGH (ref 70–99)
Potassium: 4.5 mmol/L (ref 3.5–5.1)
Sodium: 138 mmol/L (ref 135–145)
Total Bilirubin: 0.5 mg/dL (ref 0.3–1.2)
Total Protein: 7.7 g/dL (ref 6.5–8.1)

## 2019-07-27 LAB — CBC
HCT: 38.4 % (ref 36.0–46.0)
Hemoglobin: 12.5 g/dL (ref 12.0–15.0)
MCH: 28.5 pg (ref 26.0–34.0)
MCHC: 32.6 g/dL (ref 30.0–36.0)
MCV: 87.5 fL (ref 80.0–100.0)
Platelets: 308 10*3/uL (ref 150–400)
RBC: 4.39 MIL/uL (ref 3.87–5.11)
RDW: 13.7 % (ref 11.5–15.5)
WBC: 8.3 10*3/uL (ref 4.0–10.5)
nRBC: 0 % (ref 0.0–0.2)

## 2019-07-27 LAB — T4, FREE: Free T4: 0.62 ng/dL (ref 0.61–1.12)

## 2019-07-27 LAB — TSH: TSH: 2.104 u[IU]/mL (ref 0.350–4.500)

## 2019-07-27 MED ORDER — MECLIZINE HCL 25 MG PO TABS: 25.0000 mg | ORAL_TABLET | Freq: Three times a day (TID) | ORAL | 0 refills | Status: DC | PRN

## 2019-07-27 MED ORDER — IOHEXOL 300 MG/ML  SOLN
75.0000 mL | Freq: Once | INTRAMUSCULAR | Status: AC | PRN
  Administered 2019-07-27: 75 mL via INTRAVENOUS

## 2019-07-27 MED ORDER — MECLIZINE HCL 25 MG PO TABS
25.0000 mg | ORAL_TABLET | Freq: Once | ORAL | Status: AC
  Administered 2019-07-27: 25 mg via ORAL
  Filled 2019-07-27: qty 1

## 2019-07-27 MED ORDER — ACETAMINOPHEN 500 MG PO TABS
1000.0000 mg | ORAL_TABLET | Freq: Once | ORAL | Status: AC
  Administered 2019-07-27: 1000 mg via ORAL
  Filled 2019-07-27: qty 2

## 2019-07-27 MED ORDER — AMOXICILLIN-POT CLAVULANATE 875-125 MG PO TABS: 1.0000 | ORAL_TABLET | Freq: Three times a day (TID) | ORAL | 0 refills | Status: AC

## 2019-07-27 NOTE — ED Notes (Signed)
Patient given sprite.

## 2019-07-27 NOTE — ED Triage Notes (Signed)
Pt in via POV, reports rectal bleeding since last Thursday.  Also reports fatigue, sluggishness.  NAD noted at this time.

## 2019-07-27 NOTE — ED Notes (Signed)
Patient transported to CT 

## 2019-07-27 NOTE — Discharge Instructions (Addendum)
Your abdominal pain and bleeding is most likely secondary to diverticulitis.  We will give you a course of antibiotics for this.  You can take up to 1 g of Tylenol every 8 hours.  You take the meclizine to help with dizziness.  You should follow-up with GI for colonoscopy in 6 weeks.  You can get a hemoglobin recheck in 1 week if you continue to have blood in your stool.  Return to the ER for black stool, worsening pain, unable to tolerate eating or any other concerns.   IMPRESSION: 1. Small focal area of acute diverticulitis of the distal descending colon just above the posterosuperior iliac crest. 2. Hepatomegaly with diffuse hepatic steatosis. 3. Dense calcification in the proximal left coronary artery.
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
## 2019-09-08 ENCOUNTER — Other Ambulatory Visit: Payer: Self-pay

## 2019-09-08 DIAGNOSIS — Z20822 Contact with and (suspected) exposure to covid-19: Secondary | ICD-10-CM

## 2019-09-09 ENCOUNTER — Other Ambulatory Visit: Payer: Self-pay

## 2019-09-09 ENCOUNTER — Other Ambulatory Visit: Payer: Self-pay | Admitting: Family Medicine

## 2019-09-09 ENCOUNTER — Ambulatory Visit
Admission: RE | Admit: 2019-09-09 | Discharge: 2019-09-09 | Disposition: A | Discharge status: Home / Self Care | Payer: Medicare Other | Source: Ambulatory Visit | Attending: Family Medicine | Admitting: Family Medicine

## 2019-09-09 DIAGNOSIS — R1032 Left lower quadrant pain: Secondary | ICD-10-CM | POA: Diagnosis not present

## 2019-09-09 LAB — NOVEL CORONAVIRUS, NAA: SARS-CoV-2, NAA: NOT DETECTED

## 2019-09-09 MED ORDER — IOHEXOL 300 MG/ML  SOLN
100.0000 mL | Freq: Once | INTRAMUSCULAR | Status: AC | PRN
  Administered 2019-09-09: 100 mL via INTRAVENOUS

## 2019-09-10 ENCOUNTER — Telehealth: Payer: Self-pay | Admitting: Hematology

## 2019-09-10 NOTE — Telephone Encounter (Signed)
Pt is aware covid 19 test is neg  

## 2020-03-15 DIAGNOSIS — F250 Schizoaffective disorder, bipolar type: Secondary | ICD-10-CM | POA: Diagnosis not present

## 2020-03-30 DIAGNOSIS — E1165 Type 2 diabetes mellitus with hyperglycemia: Secondary | ICD-10-CM | POA: Diagnosis not present

## 2020-03-30 DIAGNOSIS — K219 Gastro-esophageal reflux disease without esophagitis: Secondary | ICD-10-CM | POA: Diagnosis not present

## 2020-03-30 DIAGNOSIS — F331 Major depressive disorder, recurrent, moderate: Secondary | ICD-10-CM | POA: Diagnosis not present

## 2020-03-30 DIAGNOSIS — G8929 Other chronic pain: Secondary | ICD-10-CM | POA: Diagnosis not present

## 2020-03-30 DIAGNOSIS — M797 Fibromyalgia: Secondary | ICD-10-CM | POA: Diagnosis not present

## 2020-03-30 DIAGNOSIS — M5442 Lumbago with sciatica, left side: Secondary | ICD-10-CM | POA: Diagnosis not present

## 2020-03-30 DIAGNOSIS — I10 Essential (primary) hypertension: Secondary | ICD-10-CM | POA: Diagnosis not present

## 2020-04-18 DIAGNOSIS — F419 Anxiety disorder, unspecified: Secondary | ICD-10-CM | POA: Diagnosis not present

## 2020-04-18 DIAGNOSIS — K219 Gastro-esophageal reflux disease without esophagitis: Secondary | ICD-10-CM | POA: Diagnosis not present

## 2020-04-18 DIAGNOSIS — I10 Essential (primary) hypertension: Secondary | ICD-10-CM | POA: Diagnosis not present

## 2020-04-18 DIAGNOSIS — F331 Major depressive disorder, recurrent, moderate: Secondary | ICD-10-CM | POA: Insufficient documentation

## 2020-04-18 DIAGNOSIS — R5383 Other fatigue: Secondary | ICD-10-CM | POA: Diagnosis not present

## 2020-04-18 DIAGNOSIS — M5440 Lumbago with sciatica, unspecified side: Secondary | ICD-10-CM | POA: Diagnosis not present

## 2020-04-18 DIAGNOSIS — Z6837 Body mass index (BMI) 37.0-37.9, adult: Secondary | ICD-10-CM | POA: Diagnosis not present

## 2020-04-18 DIAGNOSIS — Z720 Tobacco use: Secondary | ICD-10-CM | POA: Diagnosis not present

## 2020-04-18 DIAGNOSIS — W57XXXA Bitten or stung by nonvenomous insect and other nonvenomous arthropods, initial encounter: Secondary | ICD-10-CM | POA: Diagnosis not present

## 2020-04-18 DIAGNOSIS — M5416 Radiculopathy, lumbar region: Secondary | ICD-10-CM | POA: Diagnosis not present

## 2020-04-18 DIAGNOSIS — F319 Bipolar disorder, unspecified: Secondary | ICD-10-CM | POA: Diagnosis not present

## 2020-04-18 DIAGNOSIS — M797 Fibromyalgia: Secondary | ICD-10-CM | POA: Diagnosis not present

## 2020-04-18 DIAGNOSIS — E1165 Type 2 diabetes mellitus with hyperglycemia: Secondary | ICD-10-CM | POA: Diagnosis not present

## 2020-04-18 DIAGNOSIS — G8929 Other chronic pain: Secondary | ICD-10-CM | POA: Diagnosis not present

## 2020-04-18 DIAGNOSIS — E669 Obesity, unspecified: Secondary | ICD-10-CM | POA: Diagnosis not present

## 2020-04-18 DIAGNOSIS — Z79899 Other long term (current) drug therapy: Secondary | ICD-10-CM | POA: Diagnosis not present

## 2020-04-18 DIAGNOSIS — Z0000 Encounter for general adult medical examination without abnormal findings: Secondary | ICD-10-CM | POA: Diagnosis not present

## 2020-05-17 DIAGNOSIS — J4530 Mild persistent asthma, uncomplicated: Secondary | ICD-10-CM | POA: Diagnosis not present

## 2020-05-17 DIAGNOSIS — J45909 Unspecified asthma, uncomplicated: Secondary | ICD-10-CM | POA: Diagnosis not present

## 2020-05-24 DIAGNOSIS — K219 Gastro-esophageal reflux disease without esophagitis: Secondary | ICD-10-CM | POA: Diagnosis not present

## 2020-05-24 DIAGNOSIS — G8929 Other chronic pain: Secondary | ICD-10-CM | POA: Diagnosis not present

## 2020-05-24 DIAGNOSIS — F331 Major depressive disorder, recurrent, moderate: Secondary | ICD-10-CM | POA: Diagnosis not present

## 2020-05-24 DIAGNOSIS — I10 Essential (primary) hypertension: Secondary | ICD-10-CM | POA: Diagnosis not present

## 2020-05-24 DIAGNOSIS — J45909 Unspecified asthma, uncomplicated: Secondary | ICD-10-CM | POA: Diagnosis not present

## 2020-05-24 DIAGNOSIS — M5442 Lumbago with sciatica, left side: Secondary | ICD-10-CM | POA: Diagnosis not present

## 2020-05-24 DIAGNOSIS — M797 Fibromyalgia: Secondary | ICD-10-CM | POA: Diagnosis not present

## 2020-05-24 DIAGNOSIS — E1165 Type 2 diabetes mellitus with hyperglycemia: Secondary | ICD-10-CM | POA: Diagnosis not present

## 2020-05-24 DIAGNOSIS — Z79899 Other long term (current) drug therapy: Secondary | ICD-10-CM | POA: Diagnosis not present

## 2020-07-28 DIAGNOSIS — J4530 Mild persistent asthma, uncomplicated: Secondary | ICD-10-CM | POA: Diagnosis not present

## 2020-07-28 DIAGNOSIS — J45909 Unspecified asthma, uncomplicated: Secondary | ICD-10-CM | POA: Diagnosis not present

## 2020-08-10 DIAGNOSIS — M542 Cervicalgia: Secondary | ICD-10-CM | POA: Diagnosis not present

## 2020-08-10 DIAGNOSIS — R209 Unspecified disturbances of skin sensation: Secondary | ICD-10-CM | POA: Diagnosis not present

## 2020-08-10 DIAGNOSIS — M545 Low back pain, unspecified: Secondary | ICD-10-CM | POA: Diagnosis not present

## 2020-08-10 DIAGNOSIS — Z1339 Encounter for screening examination for other mental health and behavioral disorders: Secondary | ICD-10-CM | POA: Diagnosis not present

## 2020-08-10 DIAGNOSIS — Z1331 Encounter for screening for depression: Secondary | ICD-10-CM | POA: Diagnosis not present

## 2020-08-10 DIAGNOSIS — M5137 Other intervertebral disc degeneration, lumbosacral region: Secondary | ICD-10-CM | POA: Diagnosis not present

## 2020-08-10 DIAGNOSIS — F17210 Nicotine dependence, cigarettes, uncomplicated: Secondary | ICD-10-CM | POA: Diagnosis not present

## 2020-08-10 DIAGNOSIS — Z79899 Other long term (current) drug therapy: Secondary | ICD-10-CM | POA: Diagnosis not present

## 2020-08-22 DIAGNOSIS — M5137 Other intervertebral disc degeneration, lumbosacral region: Secondary | ICD-10-CM | POA: Diagnosis not present

## 2020-08-22 DIAGNOSIS — M542 Cervicalgia: Secondary | ICD-10-CM | POA: Diagnosis not present

## 2020-08-22 DIAGNOSIS — M545 Low back pain, unspecified: Secondary | ICD-10-CM | POA: Diagnosis not present

## 2020-08-22 DIAGNOSIS — R209 Unspecified disturbances of skin sensation: Secondary | ICD-10-CM | POA: Diagnosis not present

## 2020-08-22 DIAGNOSIS — F17210 Nicotine dependence, cigarettes, uncomplicated: Secondary | ICD-10-CM | POA: Diagnosis not present

## 2020-08-31 DIAGNOSIS — J209 Acute bronchitis, unspecified: Secondary | ICD-10-CM | POA: Diagnosis not present

## 2020-08-31 DIAGNOSIS — R6889 Other general symptoms and signs: Secondary | ICD-10-CM | POA: Diagnosis not present

## 2020-08-31 DIAGNOSIS — R059 Cough, unspecified: Secondary | ICD-10-CM | POA: Diagnosis not present

## 2020-08-31 DIAGNOSIS — Z20822 Contact with and (suspected) exposure to covid-19: Secondary | ICD-10-CM | POA: Diagnosis not present

## 2020-09-05 DIAGNOSIS — M5137 Other intervertebral disc degeneration, lumbosacral region: Secondary | ICD-10-CM | POA: Diagnosis not present

## 2020-09-05 DIAGNOSIS — R6889 Other general symptoms and signs: Secondary | ICD-10-CM | POA: Diagnosis not present

## 2020-09-05 DIAGNOSIS — F17210 Nicotine dependence, cigarettes, uncomplicated: Secondary | ICD-10-CM | POA: Diagnosis not present

## 2020-09-05 DIAGNOSIS — R209 Unspecified disturbances of skin sensation: Secondary | ICD-10-CM | POA: Diagnosis not present

## 2020-09-05 DIAGNOSIS — M542 Cervicalgia: Secondary | ICD-10-CM | POA: Diagnosis not present

## 2020-09-05 DIAGNOSIS — M545 Low back pain, unspecified: Secondary | ICD-10-CM | POA: Diagnosis not present

## 2020-09-07 DIAGNOSIS — J40 Bronchitis, not specified as acute or chronic: Secondary | ICD-10-CM | POA: Diagnosis not present

## 2020-09-07 DIAGNOSIS — R6889 Other general symptoms and signs: Secondary | ICD-10-CM | POA: Diagnosis not present

## 2020-09-07 DIAGNOSIS — R110 Nausea: Secondary | ICD-10-CM | POA: Diagnosis not present

## 2020-09-07 DIAGNOSIS — E1165 Type 2 diabetes mellitus with hyperglycemia: Secondary | ICD-10-CM | POA: Diagnosis not present

## 2020-09-13 DIAGNOSIS — M545 Low back pain, unspecified: Secondary | ICD-10-CM | POA: Diagnosis not present

## 2020-09-13 DIAGNOSIS — R209 Unspecified disturbances of skin sensation: Secondary | ICD-10-CM | POA: Diagnosis not present

## 2020-09-13 DIAGNOSIS — M5430 Sciatica, unspecified side: Secondary | ICD-10-CM | POA: Diagnosis not present

## 2020-09-17 ENCOUNTER — Other Ambulatory Visit: Payer: Self-pay

## 2020-09-17 ENCOUNTER — Emergency Department: Payer: Medicare HMO

## 2020-09-17 ENCOUNTER — Encounter: Payer: Self-pay | Admitting: Emergency Medicine

## 2020-09-17 ENCOUNTER — Emergency Department
Admission: EM | Admit: 2020-09-17 | Discharge: 2020-09-17 | Disposition: A | Discharge status: Home / Self Care | Payer: Medicare HMO | Attending: Emergency Medicine | Admitting: Emergency Medicine

## 2020-09-17 DIAGNOSIS — E039 Hypothyroidism, unspecified: Secondary | ICD-10-CM | POA: Diagnosis not present

## 2020-09-17 DIAGNOSIS — F251 Schizoaffective disorder, depressive type: Secondary | ICD-10-CM

## 2020-09-17 DIAGNOSIS — Z7984 Long term (current) use of oral hypoglycemic drugs: Secondary | ICD-10-CM | POA: Insufficient documentation

## 2020-09-17 DIAGNOSIS — R0789 Other chest pain: Secondary | ICD-10-CM | POA: Diagnosis not present

## 2020-09-17 DIAGNOSIS — Z79899 Other long term (current) drug therapy: Secondary | ICD-10-CM | POA: Insufficient documentation

## 2020-09-17 DIAGNOSIS — R001 Bradycardia, unspecified: Secondary | ICD-10-CM | POA: Diagnosis not present

## 2020-09-17 DIAGNOSIS — J45909 Unspecified asthma, uncomplicated: Secondary | ICD-10-CM | POA: Insufficient documentation

## 2020-09-17 DIAGNOSIS — R079 Chest pain, unspecified: Secondary | ICD-10-CM | POA: Diagnosis not present

## 2020-09-17 DIAGNOSIS — F259 Schizoaffective disorder, unspecified: Secondary | ICD-10-CM

## 2020-09-17 DIAGNOSIS — R002 Palpitations: Secondary | ICD-10-CM | POA: Insufficient documentation

## 2020-09-17 DIAGNOSIS — F1410 Cocaine abuse, uncomplicated: Secondary | ICD-10-CM | POA: Diagnosis not present

## 2020-09-17 DIAGNOSIS — R531 Weakness: Secondary | ICD-10-CM | POA: Diagnosis not present

## 2020-09-17 DIAGNOSIS — I10 Essential (primary) hypertension: Secondary | ICD-10-CM | POA: Insufficient documentation

## 2020-09-17 DIAGNOSIS — F17210 Nicotine dependence, cigarettes, uncomplicated: Secondary | ICD-10-CM | POA: Insufficient documentation

## 2020-09-17 DIAGNOSIS — R0902 Hypoxemia: Secondary | ICD-10-CM | POA: Diagnosis not present

## 2020-09-17 DIAGNOSIS — E119 Type 2 diabetes mellitus without complications: Secondary | ICD-10-CM | POA: Insufficient documentation

## 2020-09-17 DIAGNOSIS — F1010 Alcohol abuse, uncomplicated: Secondary | ICD-10-CM

## 2020-09-17 LAB — URINE DRUG SCREEN, QUALITATIVE (ARMC ONLY)
Amphetamines, Ur Screen: NOT DETECTED
Barbiturates, Ur Screen: NOT DETECTED
Benzodiazepine, Ur Scrn: NOT DETECTED
Cannabinoid 50 Ng, Ur ~~LOC~~: NOT DETECTED
Cocaine Metabolite,Ur ~~LOC~~: POSITIVE — AB
MDMA (Ecstasy)Ur Screen: NOT DETECTED
Methadone Scn, Ur: NOT DETECTED
Opiate, Ur Screen: NOT DETECTED
Phencyclidine (PCP) Ur S: NOT DETECTED
Tricyclic, Ur Screen: NOT DETECTED

## 2020-09-17 LAB — TROPONIN I (HIGH SENSITIVITY)
Troponin I (High Sensitivity): 11 ng/L (ref <18)
Troponin I (High Sensitivity): 8 ng/L (ref <18)

## 2020-09-17 LAB — CBC
HCT: 44.3 % (ref 36.0–46.0)
Hemoglobin: 15.3 g/dL — ABNORMAL HIGH (ref 12.0–15.0)
MCH: 29.3 pg (ref 26.0–34.0)
MCHC: 34.5 g/dL (ref 30.0–36.0)
MCV: 84.9 fL (ref 80.0–100.0)
Platelets: 311 10*3/uL (ref 150–400)
RBC: 5.22 MIL/uL — ABNORMAL HIGH (ref 3.87–5.11)
RDW: 13 % (ref 11.5–15.5)
WBC: 10.9 10*3/uL — ABNORMAL HIGH (ref 4.0–10.5)
nRBC: 0 % (ref 0.0–0.2)

## 2020-09-17 LAB — BASIC METABOLIC PANEL
Anion gap: 12 (ref 5–15)
BUN: 11 mg/dL (ref 6–20)
CO2: 22 mmol/L (ref 22–32)
Calcium: 9.4 mg/dL (ref 8.9–10.3)
Chloride: 97 mmol/L — ABNORMAL LOW (ref 98–111)
Creatinine, Ser: 0.78 mg/dL (ref 0.44–1.00)
GFR, Estimated: >60 mL/min (ref >60)
Glucose, Bld: 211 mg/dL — ABNORMAL HIGH (ref 70–99)
Potassium: 3.5 mmol/L (ref 3.5–5.1)
Sodium: 131 mmol/L — ABNORMAL LOW (ref 135–145)

## 2020-09-17 LAB — ETHANOL: Alcohol, Ethyl (B): <10 mg/dL (ref <10)

## 2020-09-17 LAB — PREGNANCY, URINE: Preg Test, Ur: NEGATIVE

## 2020-09-17 MED ORDER — LORAZEPAM 2 MG/ML IJ SOLN
1.0000 mg | Freq: Once | INTRAMUSCULAR | Status: AC
  Administered 2020-09-17: 1 mg via INTRAVENOUS
  Filled 2020-09-17: qty 1

## 2020-09-17 MED ORDER — QUETIAPINE FUMARATE 200 MG PO TABS
200.0000 mg | ORAL_TABLET | Freq: Once | ORAL | Status: DC
  Filled 2020-09-17: qty 1

## 2020-09-17 MED ORDER — LACTATED RINGERS IV BOLUS
1000.0000 mL | Freq: Once | INTRAVENOUS | Status: AC
  Administered 2020-09-17: 1000 mL via INTRAVENOUS

## 2020-09-17 NOTE — Social Work (Signed)
TOC CM/SW provided patient with transportation (taxi) voucher.

## 2020-09-17 NOTE — ED Provider Notes (Signed)
Emergency department transfer of care note  Care of this patient was signed out to me at the end of the previous provider shift pending evaluation by social work and psychiatry.  Both teams have evaluated this patient and have given recommendations.  Social work states patient has all resources necessary for rehabilitation in the outpatient setting.  Our on-call psychiatrist is also seen and evaluated the patient and deemed her safe for discharge at this time without any evidence of acute psychosis, suicidal ideation, homicidal ideation, or any other risk self-harm or harm to others.  The patient has been reexamined and is ready to be discharged.  All diagnostic results have been reviewed and discussed with the patient/family.  Care plan has been outlined and the patient/family understands all current diagnoses, results, and treatment plans.  There are no new complaints, changes, or physical findings at this time.  All questions have been addressed and answered.  All medications, if any, that were given while in the emergency department or any that are being prescribed have been reviewed with the patient/family.  All side effects and adverse reactions have been explained.  Patient was instructed to, and agrees to follow-up with their primary care physician as well as return to the emergency department if any new or worsening symptoms develop.   Naaman Plummer, MD 09/17/20 1101

## 2020-09-17 NOTE — ED Triage Notes (Signed)
Patient brought in by ems. Patient reports that she has been smoking cocaine. Patient states that she feels like her heart is going to beat out of her chest and that she is dizzy times two days.  Patient states that she hurts all over.
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
## 2020-09-17 NOTE — ED Notes (Signed)
E-signature not working at this time. Pt verbalized understanding of D/C instructions, prescriptions and follow up care with no further questions at this time. Pt in NAD and ambulatory at time of D/C. Pt given cab voucher. First nurse aware.

## 2020-09-17 NOTE — ED Notes (Signed)
Dr. Alexander Bergeron at bedside
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
## 2020-09-17 NOTE — ED Triage Notes (Signed)
FIRST NURSE NOTE: Pt [facility] with reports of cocaine and etoh use for the past several days, has not had regular meds in several days,   Hx HTN, DM and asthma.  Pt requesting to lay on the floor in lobby advised patient she cannot do that. Offered her to sit in lobby chair but declined.

## 2020-09-25 DIAGNOSIS — M5137 Other intervertebral disc degeneration, lumbosacral region: Secondary | ICD-10-CM | POA: Diagnosis not present

## 2020-09-25 DIAGNOSIS — M545 Low back pain, unspecified: Secondary | ICD-10-CM | POA: Diagnosis not present

## 2020-09-25 DIAGNOSIS — R209 Unspecified disturbances of skin sensation: Secondary | ICD-10-CM | POA: Diagnosis not present

## 2020-09-25 DIAGNOSIS — F17210 Nicotine dependence, cigarettes, uncomplicated: Secondary | ICD-10-CM | POA: Diagnosis not present

## 2020-09-25 DIAGNOSIS — Z79899 Other long term (current) drug therapy: Secondary | ICD-10-CM | POA: Diagnosis not present

## 2020-09-25 DIAGNOSIS — M542 Cervicalgia: Secondary | ICD-10-CM | POA: Diagnosis not present

## 2020-10-02 ENCOUNTER — Other Ambulatory Visit: Payer: Self-pay | Admitting: Internal Medicine

## 2020-10-02 ENCOUNTER — Ambulatory Visit
Admission: RE | Admit: 2020-10-02 | Discharge: 2020-10-02 | Disposition: A | Discharge status: Home / Self Care | Payer: Medicare HMO | Source: Ambulatory Visit | Attending: Internal Medicine | Admitting: Internal Medicine

## 2020-10-02 ENCOUNTER — Other Ambulatory Visit: Payer: Self-pay

## 2020-10-02 DIAGNOSIS — K449 Diaphragmatic hernia without obstruction or gangrene: Secondary | ICD-10-CM | POA: Diagnosis not present

## 2020-10-02 DIAGNOSIS — I700 Atherosclerosis of aorta: Secondary | ICD-10-CM | POA: Diagnosis not present

## 2020-10-02 DIAGNOSIS — F333 Major depressive disorder, recurrent, severe with psychotic symptoms: Secondary | ICD-10-CM | POA: Diagnosis not present

## 2020-10-02 DIAGNOSIS — J440 Chronic obstructive pulmonary disease with acute lower respiratory infection: Secondary | ICD-10-CM

## 2020-10-02 DIAGNOSIS — R053 Chronic cough: Secondary | ICD-10-CM

## 2020-10-02 DIAGNOSIS — R6889 Other general symptoms and signs: Secondary | ICD-10-CM | POA: Diagnosis not present

## 2020-10-02 DIAGNOSIS — E1165 Type 2 diabetes mellitus with hyperglycemia: Secondary | ICD-10-CM | POA: Diagnosis not present

## 2020-10-02 DIAGNOSIS — J209 Acute bronchitis, unspecified: Secondary | ICD-10-CM

## 2020-10-02 DIAGNOSIS — R002 Palpitations: Secondary | ICD-10-CM | POA: Diagnosis not present

## 2020-10-02 DIAGNOSIS — Z720 Tobacco use: Secondary | ICD-10-CM | POA: Diagnosis not present

## 2020-10-02 DIAGNOSIS — Z6835 Body mass index (BMI) 35.0-35.9, adult: Secondary | ICD-10-CM | POA: Diagnosis not present

## 2020-10-02 DIAGNOSIS — I2510 Atherosclerotic heart disease of native coronary artery without angina pectoris: Secondary | ICD-10-CM | POA: Diagnosis not present

## 2020-10-02 DIAGNOSIS — Z6834 Body mass index (BMI) 34.0-34.9, adult: Secondary | ICD-10-CM | POA: Diagnosis not present

## 2020-10-02 DIAGNOSIS — F319 Bipolar disorder, unspecified: Secondary | ICD-10-CM | POA: Diagnosis not present

## 2020-10-02 DIAGNOSIS — E669 Obesity, unspecified: Secondary | ICD-10-CM | POA: Diagnosis not present

## 2020-10-02 DIAGNOSIS — Z114 Encounter for screening for human immunodeficiency virus [HIV]: Secondary | ICD-10-CM | POA: Diagnosis not present

## 2020-10-02 MED ORDER — IOHEXOL 300 MG/ML  SOLN
75.0000 mL | Freq: Once | INTRAMUSCULAR | Status: AC | PRN
  Administered 2020-10-02: 75 mL via INTRAVENOUS

## 2020-10-03 DIAGNOSIS — J440 Chronic obstructive pulmonary disease with acute lower respiratory infection: Secondary | ICD-10-CM | POA: Diagnosis not present

## 2020-10-03 DIAGNOSIS — F901 Attention-deficit hyperactivity disorder, predominantly hyperactive type: Secondary | ICD-10-CM | POA: Diagnosis not present

## 2020-10-03 DIAGNOSIS — I10 Essential (primary) hypertension: Secondary | ICD-10-CM | POA: Diagnosis not present

## 2020-10-03 DIAGNOSIS — M797 Fibromyalgia: Secondary | ICD-10-CM | POA: Diagnosis not present

## 2020-10-03 DIAGNOSIS — E1165 Type 2 diabetes mellitus with hyperglycemia: Secondary | ICD-10-CM | POA: Diagnosis not present

## 2020-10-03 DIAGNOSIS — J209 Acute bronchitis, unspecified: Secondary | ICD-10-CM | POA: Diagnosis not present

## 2020-10-03 DIAGNOSIS — F333 Major depressive disorder, recurrent, severe with psychotic symptoms: Secondary | ICD-10-CM | POA: Diagnosis not present

## 2020-10-30 DIAGNOSIS — F17210 Nicotine dependence, cigarettes, uncomplicated: Secondary | ICD-10-CM | POA: Diagnosis not present

## 2020-10-30 DIAGNOSIS — E559 Vitamin D deficiency, unspecified: Secondary | ICD-10-CM | POA: Diagnosis not present

## 2020-10-30 DIAGNOSIS — Z79899 Other long term (current) drug therapy: Secondary | ICD-10-CM | POA: Diagnosis not present

## 2020-10-30 DIAGNOSIS — M129 Arthropathy, unspecified: Secondary | ICD-10-CM | POA: Diagnosis not present

## 2020-10-30 DIAGNOSIS — M5137 Other intervertebral disc degeneration, lumbosacral region: Secondary | ICD-10-CM | POA: Diagnosis not present

## 2020-10-30 DIAGNOSIS — M542 Cervicalgia: Secondary | ICD-10-CM | POA: Diagnosis not present

## 2020-11-02 DIAGNOSIS — R6889 Other general symptoms and signs: Secondary | ICD-10-CM | POA: Diagnosis not present

## 2020-11-28 DIAGNOSIS — G894 Chronic pain syndrome: Secondary | ICD-10-CM | POA: Diagnosis not present

## 2020-11-28 DIAGNOSIS — M5137 Other intervertebral disc degeneration, lumbosacral region: Secondary | ICD-10-CM | POA: Diagnosis not present

## 2020-11-28 DIAGNOSIS — M542 Cervicalgia: Secondary | ICD-10-CM | POA: Diagnosis not present

## 2020-11-28 DIAGNOSIS — Z79899 Other long term (current) drug therapy: Secondary | ICD-10-CM | POA: Diagnosis not present

## 2020-12-07 DIAGNOSIS — F17210 Nicotine dependence, cigarettes, uncomplicated: Secondary | ICD-10-CM | POA: Diagnosis not present

## 2020-12-07 DIAGNOSIS — Z0000 Encounter for general adult medical examination without abnormal findings: Secondary | ICD-10-CM | POA: Diagnosis not present

## 2020-12-07 DIAGNOSIS — F319 Bipolar disorder, unspecified: Secondary | ICD-10-CM | POA: Diagnosis not present

## 2020-12-07 DIAGNOSIS — R8290 Unspecified abnormal findings in urine: Secondary | ICD-10-CM | POA: Diagnosis not present

## 2020-12-07 DIAGNOSIS — E1165 Type 2 diabetes mellitus with hyperglycemia: Secondary | ICD-10-CM | POA: Diagnosis not present

## 2020-12-07 DIAGNOSIS — I700 Atherosclerosis of aorta: Secondary | ICD-10-CM | POA: Diagnosis not present

## 2020-12-07 DIAGNOSIS — J449 Chronic obstructive pulmonary disease, unspecified: Secondary | ICD-10-CM | POA: Diagnosis not present

## 2020-12-07 DIAGNOSIS — G8929 Other chronic pain: Secondary | ICD-10-CM | POA: Diagnosis not present

## 2020-12-07 DIAGNOSIS — J4530 Mild persistent asthma, uncomplicated: Secondary | ICD-10-CM | POA: Diagnosis not present

## 2020-12-07 DIAGNOSIS — Z23 Encounter for immunization: Secondary | ICD-10-CM | POA: Diagnosis not present

## 2020-12-07 DIAGNOSIS — F901 Attention-deficit hyperactivity disorder, predominantly hyperactive type: Secondary | ICD-10-CM | POA: Diagnosis not present

## 2020-12-07 DIAGNOSIS — M797 Fibromyalgia: Secondary | ICD-10-CM | POA: Diagnosis not present

## 2020-12-07 DIAGNOSIS — Z124 Encounter for screening for malignant neoplasm of cervix: Secondary | ICD-10-CM | POA: Diagnosis not present

## 2020-12-07 DIAGNOSIS — I10 Essential (primary) hypertension: Secondary | ICD-10-CM | POA: Diagnosis not present

## 2020-12-07 DIAGNOSIS — E669 Obesity, unspecified: Secondary | ICD-10-CM | POA: Diagnosis not present

## 2020-12-07 DIAGNOSIS — F419 Anxiety disorder, unspecified: Secondary | ICD-10-CM | POA: Diagnosis not present

## 2020-12-07 DIAGNOSIS — E119 Type 2 diabetes mellitus without complications: Secondary | ICD-10-CM | POA: Diagnosis not present

## 2020-12-07 DIAGNOSIS — M5441 Lumbago with sciatica, right side: Secondary | ICD-10-CM | POA: Diagnosis not present

## 2020-12-07 DIAGNOSIS — F331 Major depressive disorder, recurrent, moderate: Secondary | ICD-10-CM | POA: Diagnosis not present

## 2020-12-07 DIAGNOSIS — K219 Gastro-esophageal reflux disease without esophagitis: Secondary | ICD-10-CM | POA: Diagnosis not present

## 2020-12-07 DIAGNOSIS — M5442 Lumbago with sciatica, left side: Secondary | ICD-10-CM | POA: Diagnosis not present

## 2020-12-07 DIAGNOSIS — R6889 Other general symptoms and signs: Secondary | ICD-10-CM | POA: Diagnosis not present

## 2020-12-08 ENCOUNTER — Other Ambulatory Visit: Payer: Self-pay | Admitting: Internal Medicine

## 2020-12-08 DIAGNOSIS — M542 Cervicalgia: Secondary | ICD-10-CM | POA: Diagnosis not present

## 2020-12-08 DIAGNOSIS — M545 Low back pain, unspecified: Secondary | ICD-10-CM | POA: Diagnosis not present

## 2020-12-08 DIAGNOSIS — R6889 Other general symptoms and signs: Secondary | ICD-10-CM | POA: Diagnosis not present

## 2020-12-08 DIAGNOSIS — Z1231 Encounter for screening mammogram for malignant neoplasm of breast: Secondary | ICD-10-CM

## 2020-12-08 DIAGNOSIS — M5412 Radiculopathy, cervical region: Secondary | ICD-10-CM | POA: Diagnosis not present

## 2020-12-08 DIAGNOSIS — M5416 Radiculopathy, lumbar region: Secondary | ICD-10-CM | POA: Diagnosis not present

## 2020-12-11 ENCOUNTER — Other Ambulatory Visit: Payer: Self-pay | Admitting: Orthopaedic Surgery

## 2020-12-11 DIAGNOSIS — E119 Type 2 diabetes mellitus without complications: Secondary | ICD-10-CM | POA: Diagnosis not present

## 2020-12-11 DIAGNOSIS — R6889 Other general symptoms and signs: Secondary | ICD-10-CM | POA: Diagnosis not present

## 2020-12-11 DIAGNOSIS — G894 Chronic pain syndrome: Secondary | ICD-10-CM | POA: Diagnosis not present

## 2020-12-11 DIAGNOSIS — M5412 Radiculopathy, cervical region: Secondary | ICD-10-CM

## 2020-12-11 DIAGNOSIS — M5137 Other intervertebral disc degeneration, lumbosacral region: Secondary | ICD-10-CM | POA: Diagnosis not present

## 2020-12-11 DIAGNOSIS — Z79899 Other long term (current) drug therapy: Secondary | ICD-10-CM | POA: Diagnosis not present

## 2020-12-11 DIAGNOSIS — M5416 Radiculopathy, lumbar region: Secondary | ICD-10-CM

## 2020-12-11 DIAGNOSIS — M542 Cervicalgia: Secondary | ICD-10-CM

## 2020-12-11 DIAGNOSIS — M545 Low back pain, unspecified: Secondary | ICD-10-CM

## 2020-12-11 DIAGNOSIS — F32A Depression, unspecified: Secondary | ICD-10-CM | POA: Diagnosis not present

## 2020-12-13 DIAGNOSIS — G894 Chronic pain syndrome: Secondary | ICD-10-CM | POA: Diagnosis not present

## 2020-12-13 DIAGNOSIS — Z79899 Other long term (current) drug therapy: Secondary | ICD-10-CM | POA: Diagnosis not present

## 2020-12-13 DIAGNOSIS — M542 Cervicalgia: Secondary | ICD-10-CM | POA: Diagnosis not present

## 2021-01-08 DIAGNOSIS — F17200 Nicotine dependence, unspecified, uncomplicated: Secondary | ICD-10-CM | POA: Diagnosis not present

## 2021-01-08 DIAGNOSIS — M542 Cervicalgia: Secondary | ICD-10-CM | POA: Diagnosis not present

## 2021-01-08 DIAGNOSIS — Z6835 Body mass index (BMI) 35.0-35.9, adult: Secondary | ICD-10-CM | POA: Diagnosis not present

## 2021-01-08 DIAGNOSIS — E119 Type 2 diabetes mellitus without complications: Secondary | ICD-10-CM | POA: Diagnosis not present

## 2021-01-08 DIAGNOSIS — F17210 Nicotine dependence, cigarettes, uncomplicated: Secondary | ICD-10-CM | POA: Diagnosis not present

## 2021-01-08 DIAGNOSIS — F32A Depression, unspecified: Secondary | ICD-10-CM | POA: Diagnosis not present

## 2021-01-08 DIAGNOSIS — G894 Chronic pain syndrome: Secondary | ICD-10-CM | POA: Diagnosis not present

## 2021-01-08 DIAGNOSIS — M5137 Other intervertebral disc degeneration, lumbosacral region: Secondary | ICD-10-CM | POA: Diagnosis not present

## 2021-01-08 DIAGNOSIS — R6889 Other general symptoms and signs: Secondary | ICD-10-CM | POA: Diagnosis not present

## 2021-01-08 DIAGNOSIS — Z79899 Other long term (current) drug therapy: Secondary | ICD-10-CM | POA: Diagnosis not present

## 2021-01-10 DIAGNOSIS — M542 Cervicalgia: Secondary | ICD-10-CM | POA: Diagnosis not present

## 2021-01-10 DIAGNOSIS — Z79899 Other long term (current) drug therapy: Secondary | ICD-10-CM | POA: Diagnosis not present

## 2021-01-10 DIAGNOSIS — G894 Chronic pain syndrome: Secondary | ICD-10-CM | POA: Diagnosis not present

## 2021-01-20 DIAGNOSIS — R197 Diarrhea, unspecified: Secondary | ICD-10-CM | POA: Diagnosis not present

## 2021-01-20 DIAGNOSIS — K5732 Diverticulitis of large intestine without perforation or abscess without bleeding: Secondary | ICD-10-CM | POA: Diagnosis not present

## 2021-01-20 DIAGNOSIS — R159 Full incontinence of feces: Secondary | ICD-10-CM | POA: Diagnosis not present

## 2021-01-20 DIAGNOSIS — R6889 Other general symptoms and signs: Secondary | ICD-10-CM | POA: Diagnosis not present

## 2021-01-20 DIAGNOSIS — R1084 Generalized abdominal pain: Secondary | ICD-10-CM | POA: Diagnosis not present

## 2021-01-20 DIAGNOSIS — K219 Gastro-esophageal reflux disease without esophagitis: Secondary | ICD-10-CM | POA: Diagnosis not present

## 2021-01-25 DIAGNOSIS — F32A Depression, unspecified: Secondary | ICD-10-CM | POA: Diagnosis not present

## 2021-01-25 DIAGNOSIS — M5137 Other intervertebral disc degeneration, lumbosacral region: Secondary | ICD-10-CM | POA: Diagnosis not present

## 2021-01-25 DIAGNOSIS — R6889 Other general symptoms and signs: Secondary | ICD-10-CM | POA: Diagnosis not present

## 2021-01-25 DIAGNOSIS — Z79899 Other long term (current) drug therapy: Secondary | ICD-10-CM | POA: Diagnosis not present

## 2021-01-25 DIAGNOSIS — M542 Cervicalgia: Secondary | ICD-10-CM | POA: Diagnosis not present

## 2021-01-25 DIAGNOSIS — G894 Chronic pain syndrome: Secondary | ICD-10-CM | POA: Diagnosis not present

## 2021-02-06 DIAGNOSIS — E119 Type 2 diabetes mellitus without complications: Secondary | ICD-10-CM | POA: Diagnosis not present

## 2021-02-06 DIAGNOSIS — K219 Gastro-esophageal reflux disease without esophagitis: Secondary | ICD-10-CM | POA: Diagnosis not present

## 2021-02-06 DIAGNOSIS — Z1211 Encounter for screening for malignant neoplasm of colon: Secondary | ICD-10-CM | POA: Diagnosis not present

## 2021-02-06 DIAGNOSIS — Z01818 Encounter for other preprocedural examination: Secondary | ICD-10-CM | POA: Diagnosis not present

## 2021-02-10 DIAGNOSIS — M542 Cervicalgia: Secondary | ICD-10-CM | POA: Diagnosis not present

## 2021-02-10 DIAGNOSIS — Z79899 Other long term (current) drug therapy: Secondary | ICD-10-CM | POA: Diagnosis not present

## 2021-02-10 DIAGNOSIS — G894 Chronic pain syndrome: Secondary | ICD-10-CM | POA: Diagnosis not present

## 2021-02-12 DIAGNOSIS — K2970 Gastritis, unspecified, without bleeding: Secondary | ICD-10-CM | POA: Diagnosis not present

## 2021-02-12 DIAGNOSIS — A048 Other specified bacterial intestinal infections: Secondary | ICD-10-CM | POA: Diagnosis not present

## 2021-02-12 DIAGNOSIS — K900 Celiac disease: Secondary | ICD-10-CM | POA: Diagnosis not present

## 2021-02-12 DIAGNOSIS — K2270 Barrett's esophagus without dysplasia: Secondary | ICD-10-CM | POA: Diagnosis not present

## 2021-02-13 DIAGNOSIS — K200 Eosinophilic esophagitis: Secondary | ICD-10-CM | POA: Diagnosis not present

## 2021-02-23 DIAGNOSIS — E119 Type 2 diabetes mellitus without complications: Secondary | ICD-10-CM | POA: Diagnosis not present

## 2021-02-23 DIAGNOSIS — R6889 Other general symptoms and signs: Secondary | ICD-10-CM | POA: Diagnosis not present

## 2021-02-23 DIAGNOSIS — G894 Chronic pain syndrome: Secondary | ICD-10-CM | POA: Diagnosis not present

## 2021-02-23 DIAGNOSIS — M542 Cervicalgia: Secondary | ICD-10-CM | POA: Diagnosis not present

## 2021-02-23 DIAGNOSIS — F17200 Nicotine dependence, unspecified, uncomplicated: Secondary | ICD-10-CM | POA: Diagnosis not present

## 2021-02-23 DIAGNOSIS — F17210 Nicotine dependence, cigarettes, uncomplicated: Secondary | ICD-10-CM | POA: Diagnosis not present

## 2021-02-23 DIAGNOSIS — M5137 Other intervertebral disc degeneration, lumbosacral region: Secondary | ICD-10-CM | POA: Diagnosis not present

## 2021-02-23 DIAGNOSIS — Z6835 Body mass index (BMI) 35.0-35.9, adult: Secondary | ICD-10-CM | POA: Diagnosis not present

## 2021-02-23 DIAGNOSIS — Z79899 Other long term (current) drug therapy: Secondary | ICD-10-CM | POA: Diagnosis not present

## 2021-03-06 DIAGNOSIS — R6889 Other general symptoms and signs: Secondary | ICD-10-CM | POA: Diagnosis not present

## 2021-03-06 DIAGNOSIS — K5792 Diverticulitis of intestine, part unspecified, without perforation or abscess without bleeding: Secondary | ICD-10-CM | POA: Diagnosis not present

## 2021-03-12 DIAGNOSIS — Z79899 Other long term (current) drug therapy: Secondary | ICD-10-CM | POA: Diagnosis not present

## 2021-03-12 DIAGNOSIS — M542 Cervicalgia: Secondary | ICD-10-CM | POA: Diagnosis not present

## 2021-03-12 DIAGNOSIS — G894 Chronic pain syndrome: Secondary | ICD-10-CM | POA: Diagnosis not present

## 2021-04-06 DIAGNOSIS — Z79899 Other long term (current) drug therapy: Secondary | ICD-10-CM | POA: Diagnosis not present

## 2021-04-06 DIAGNOSIS — M5137 Other intervertebral disc degeneration, lumbosacral region: Secondary | ICD-10-CM | POA: Diagnosis not present

## 2021-04-06 DIAGNOSIS — G894 Chronic pain syndrome: Secondary | ICD-10-CM | POA: Diagnosis not present

## 2021-04-06 DIAGNOSIS — M542 Cervicalgia: Secondary | ICD-10-CM | POA: Diagnosis not present

## 2021-04-06 DIAGNOSIS — R030 Elevated blood-pressure reading, without diagnosis of hypertension: Secondary | ICD-10-CM | POA: Diagnosis not present

## 2021-04-06 DIAGNOSIS — R6889 Other general symptoms and signs: Secondary | ICD-10-CM | POA: Diagnosis not present

## 2021-04-06 DIAGNOSIS — F17210 Nicotine dependence, cigarettes, uncomplicated: Secondary | ICD-10-CM | POA: Diagnosis not present

## 2021-04-06 DIAGNOSIS — F17200 Nicotine dependence, unspecified, uncomplicated: Secondary | ICD-10-CM | POA: Diagnosis not present

## 2021-04-06 DIAGNOSIS — Z6835 Body mass index (BMI) 35.0-35.9, adult: Secondary | ICD-10-CM | POA: Diagnosis not present

## 2021-04-06 DIAGNOSIS — E119 Type 2 diabetes mellitus without complications: Secondary | ICD-10-CM | POA: Diagnosis not present

## 2021-04-12 DIAGNOSIS — Z79899 Other long term (current) drug therapy: Secondary | ICD-10-CM | POA: Diagnosis not present

## 2021-04-12 DIAGNOSIS — M542 Cervicalgia: Secondary | ICD-10-CM | POA: Diagnosis not present

## 2021-04-12 DIAGNOSIS — G894 Chronic pain syndrome: Secondary | ICD-10-CM | POA: Diagnosis not present

## 2021-05-12 DIAGNOSIS — Z79899 Other long term (current) drug therapy: Secondary | ICD-10-CM | POA: Diagnosis not present

## 2021-05-12 DIAGNOSIS — G894 Chronic pain syndrome: Secondary | ICD-10-CM | POA: Diagnosis not present

## 2021-05-12 DIAGNOSIS — M542 Cervicalgia: Secondary | ICD-10-CM | POA: Diagnosis not present

## 2021-05-19 ENCOUNTER — Other Ambulatory Visit: Payer: Self-pay

## 2021-05-19 ENCOUNTER — Encounter: Payer: Self-pay | Admitting: Emergency Medicine

## 2021-05-19 DIAGNOSIS — J45909 Unspecified asthma, uncomplicated: Secondary | ICD-10-CM | POA: Diagnosis not present

## 2021-05-19 DIAGNOSIS — Z20822 Contact with and (suspected) exposure to covid-19: Secondary | ICD-10-CM | POA: Insufficient documentation

## 2021-05-19 DIAGNOSIS — F250 Schizoaffective disorder, bipolar type: Secondary | ICD-10-CM | POA: Diagnosis not present

## 2021-05-19 DIAGNOSIS — F22 Delusional disorders: Secondary | ICD-10-CM | POA: Diagnosis not present

## 2021-05-19 DIAGNOSIS — I10 Essential (primary) hypertension: Secondary | ICD-10-CM | POA: Diagnosis not present

## 2021-05-19 DIAGNOSIS — Z7984 Long term (current) use of oral hypoglycemic drugs: Secondary | ICD-10-CM | POA: Insufficient documentation

## 2021-05-19 DIAGNOSIS — F14922 Cocaine use, unspecified with intoxication with perceptual disturbance: Secondary | ICD-10-CM | POA: Insufficient documentation

## 2021-05-19 DIAGNOSIS — R440 Auditory hallucinations: Secondary | ICD-10-CM | POA: Diagnosis present

## 2021-05-19 DIAGNOSIS — Y900 Blood alcohol level of less than 20 mg/100 ml: Secondary | ICD-10-CM | POA: Diagnosis not present

## 2021-05-19 DIAGNOSIS — E039 Hypothyroidism, unspecified: Secondary | ICD-10-CM | POA: Diagnosis not present

## 2021-05-19 DIAGNOSIS — F17210 Nicotine dependence, cigarettes, uncomplicated: Secondary | ICD-10-CM | POA: Insufficient documentation

## 2021-05-19 DIAGNOSIS — E119 Type 2 diabetes mellitus without complications: Secondary | ICD-10-CM | POA: Insufficient documentation

## 2021-05-19 DIAGNOSIS — R079 Chest pain, unspecified: Secondary | ICD-10-CM | POA: Diagnosis not present

## 2021-05-19 DIAGNOSIS — F259 Schizoaffective disorder, unspecified: Secondary | ICD-10-CM | POA: Insufficient documentation

## 2021-05-19 DIAGNOSIS — Z79899 Other long term (current) drug therapy: Secondary | ICD-10-CM | POA: Insufficient documentation

## 2021-05-19 LAB — COMPREHENSIVE METABOLIC PANEL
ALT: 50 U/L — ABNORMAL HIGH (ref 0–44)
AST: 35 U/L (ref 15–41)
Albumin: 4.9 g/dL (ref 3.5–5.0)
Alkaline Phosphatase: 67 U/L (ref 38–126)
Anion gap: 16 — ABNORMAL HIGH (ref 5–15)
BUN: 12 mg/dL (ref 6–20)
CO2: 20 mmol/L — ABNORMAL LOW (ref 22–32)
Calcium: 9.7 mg/dL (ref 8.9–10.3)
Chloride: 94 mmol/L — ABNORMAL LOW (ref 98–111)
Creatinine, Ser: 0.7 mg/dL (ref 0.44–1.00)
GFR, Estimated: >60 mL/min (ref >60)
Glucose, Bld: 219 mg/dL — ABNORMAL HIGH (ref 70–99)
Potassium: 3.8 mmol/L (ref 3.5–5.1)
Sodium: 130 mmol/L — ABNORMAL LOW (ref 135–145)
Total Bilirubin: 1.3 mg/dL — ABNORMAL HIGH (ref 0.3–1.2)
Total Protein: 8.5 g/dL — ABNORMAL HIGH (ref 6.5–8.1)

## 2021-05-19 LAB — CBC
HCT: 43.3 % (ref 36.0–46.0)
Hemoglobin: 15.3 g/dL — ABNORMAL HIGH (ref 12.0–15.0)
MCH: 30.1 pg (ref 26.0–34.0)
MCHC: 35.3 g/dL (ref 30.0–36.0)
MCV: 85.1 fL (ref 80.0–100.0)
Platelets: 379 10*3/uL (ref 150–400)
RBC: 5.09 MIL/uL (ref 3.87–5.11)
RDW: 13.3 % (ref 11.5–15.5)
WBC: 11.3 10*3/uL — ABNORMAL HIGH (ref 4.0–10.5)
nRBC: 0 % (ref 0.0–0.2)

## 2021-05-19 LAB — ETHANOL: Alcohol, Ethyl (B): <10 mg/dL (ref <10)

## 2021-05-19 NOTE — ED Triage Notes (Signed)
This RN spoke with Dr. Kerman Passey regarding orders, VORB for blood work.
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
## 2021-05-20 ENCOUNTER — Emergency Department
Admission: EM | Admit: 2021-05-20 | Discharge: 2021-05-21 | Disposition: A | Discharge status: Home / Self Care | Payer: Medicare HMO | Attending: Emergency Medicine | Admitting: Emergency Medicine

## 2021-05-20 DIAGNOSIS — F259 Schizoaffective disorder, unspecified: Secondary | ICD-10-CM | POA: Diagnosis present

## 2021-05-20 DIAGNOSIS — F14922 Cocaine use, unspecified with intoxication with perceptual disturbance: Secondary | ICD-10-CM

## 2021-05-20 DIAGNOSIS — F1010 Alcohol abuse, uncomplicated: Secondary | ICD-10-CM | POA: Diagnosis present

## 2021-05-20 DIAGNOSIS — F1410 Cocaine abuse, uncomplicated: Secondary | ICD-10-CM | POA: Diagnosis present

## 2021-05-20 LAB — URINE DRUG SCREEN, QUALITATIVE (ARMC ONLY)
Amphetamines, Ur Screen: NOT DETECTED
Barbiturates, Ur Screen: NOT DETECTED
Benzodiazepine, Ur Scrn: NOT DETECTED
Cannabinoid 50 Ng, Ur ~~LOC~~: NOT DETECTED
Cocaine Metabolite,Ur ~~LOC~~: POSITIVE — AB
MDMA (Ecstasy)Ur Screen: NOT DETECTED
Methadone Scn, Ur: NOT DETECTED
Opiate, Ur Screen: NOT DETECTED
Phencyclidine (PCP) Ur S: NOT DETECTED
Tricyclic, Ur Screen: POSITIVE — AB

## 2021-05-20 LAB — URINALYSIS, COMPLETE (UACMP) WITH MICROSCOPIC
Bilirubin Urine: NEGATIVE
Glucose, UA: >=500 mg/dL — AB
Hgb urine dipstick: NEGATIVE
Ketones, ur: NEGATIVE mg/dL
Nitrite: NEGATIVE
Protein, ur: NEGATIVE mg/dL
Specific Gravity, Urine: 1.014 (ref 1.005–1.030)
pH: 6 (ref 5.0–8.0)

## 2021-05-20 LAB — CBG MONITORING, ED
Glucose-Capillary: 427 mg/dL — ABNORMAL HIGH (ref 70–99)
Glucose-Capillary: 236 mg/dL — ABNORMAL HIGH (ref 70–99)

## 2021-05-20 MED ORDER — ALBUTEROL SULFATE HFA 108 (90 BASE) MCG/ACT IN AERS
2.0000 | INHALATION_SPRAY | RESPIRATORY_TRACT | Status: DC
  Administered 2021-05-20: 2 via RESPIRATORY_TRACT
  Filled 2021-05-20: qty 6.7

## 2021-05-20 MED ORDER — TIZANIDINE HCL 4 MG PO TABS
4.0000 mg | ORAL_TABLET | Freq: Four times a day (QID) | ORAL | Status: DC | PRN
  Filled 2021-05-20: qty 1

## 2021-05-20 MED ORDER — LORAZEPAM 2 MG PO TABS
2.0000 mg | ORAL_TABLET | ORAL | Status: AC
  Administered 2021-05-20: 2 mg via ORAL
  Filled 2021-05-20: qty 1

## 2021-05-20 MED ORDER — SERTRALINE HCL 100 MG PO TABS
100.0000 mg | ORAL_TABLET | Freq: Every day | ORAL | Status: DC
  Administered 2021-05-20: 100 mg via ORAL
  Filled 2021-05-20: qty 2

## 2021-05-20 MED ORDER — GLIMEPIRIDE 4 MG PO TABS
4.0000 mg | ORAL_TABLET | Freq: Every day | ORAL | Status: DC
  Administered 2021-05-20 – 2021-05-21 (×2): 4 mg via ORAL
  Filled 2021-05-20 (×2): qty 1

## 2021-05-20 MED ORDER — QUETIAPINE FUMARATE 200 MG PO TABS: 200.0000 mg | ORAL_TABLET | Freq: Every evening | ORAL | Status: DC | PRN

## 2021-05-20 MED ORDER — LORAZEPAM 1 MG PO TABS
1.0000 mg | ORAL_TABLET | Freq: Once | ORAL | Status: AC
  Administered 2021-05-20: 1 mg via ORAL
  Filled 2021-05-20: qty 1

## 2021-05-20 MED ORDER — HYDROXYZINE HCL 25 MG PO TABS
50.0000 mg | ORAL_TABLET | Freq: Four times a day (QID) | ORAL | Status: DC | PRN
  Administered 2021-05-21: 50 mg via ORAL
  Filled 2021-05-20: qty 2

## 2021-05-20 MED ORDER — FLUTICASONE PROPIONATE 50 MCG/ACT NA SUSP
2.0000 | Freq: Every day | NASAL | Status: DC
  Administered 2021-05-21: 2 via NASAL
  Filled 2021-05-20: qty 16

## 2021-05-20 MED ORDER — BENZTROPINE MESYLATE 1 MG/ML IJ SOLN
1.0000 mg | Freq: Four times a day (QID) | INTRAMUSCULAR | Status: DC | PRN
  Filled 2021-05-20: qty 1

## 2021-05-20 MED ORDER — INSULIN ASPART 100 UNIT/ML IJ SOLN
8.0000 [IU] | Freq: Once | INTRAMUSCULAR | Status: AC
  Administered 2021-05-20: 8 [IU] via SUBCUTANEOUS
  Filled 2021-05-20: qty 1

## 2021-05-20 MED ORDER — OXYCODONE HCL 5 MG PO TABS
10.0000 mg | ORAL_TABLET | Freq: Three times a day (TID) | ORAL | Status: DC | PRN
Start: 2021-05-20 — End: 2021-05-21

## 2021-05-20 MED ORDER — ONDANSETRON HCL 4 MG PO TABS: 4.0000 mg | ORAL_TABLET | Freq: Three times a day (TID) | ORAL | Status: DC | PRN

## 2021-05-20 MED ORDER — BACLOFEN 10 MG PO TABS
10.0000 mg | ORAL_TABLET | Freq: Three times a day (TID) | ORAL | Status: DC
  Administered 2021-05-20 – 2021-05-21 (×3): 10 mg via ORAL
  Filled 2021-05-20 (×5): qty 1

## 2021-05-20 MED ORDER — QUETIAPINE FUMARATE 200 MG PO TABS: 200.0000 mg | ORAL_TABLET | Freq: Every day | ORAL | Status: DC

## 2021-05-20 MED ORDER — PANTOPRAZOLE SODIUM 40 MG PO TBEC
40.0000 mg | DELAYED_RELEASE_TABLET | Freq: Every day | ORAL | Status: DC
  Administered 2021-05-20 – 2021-05-21 (×2): 40 mg via ORAL
  Filled 2021-05-20 (×2): qty 1

## 2021-05-20 MED ORDER — ATORVASTATIN CALCIUM 20 MG PO TABS
10.0000 mg | ORAL_TABLET | Freq: Every day | ORAL | Status: DC
  Administered 2021-05-20 – 2021-05-21 (×2): 10 mg via ORAL
  Filled 2021-05-20 (×2): qty 1

## 2021-05-20 MED ORDER — RISPERIDONE 1 MG PO TABS: 0.5000 mg | ORAL_TABLET | Freq: Two times a day (BID) | ORAL | Status: DC

## 2021-05-20 MED ORDER — LORAZEPAM 2 MG/ML IJ SOLN: 2.0000 mg | Freq: Once | INTRAMUSCULAR | Status: DC

## 2021-05-20 MED ORDER — RISPERIDONE 1 MG PO TABS
1.0000 mg | ORAL_TABLET | Freq: Two times a day (BID) | ORAL | Status: DC
  Administered 2021-05-20 – 2021-05-21 (×2): 1 mg via ORAL
  Filled 2021-05-20 (×2): qty 1

## 2021-05-20 MED ORDER — FENOFIBRATE 160 MG PO TABS
160.0000 mg | ORAL_TABLET | Freq: Every day | ORAL | Status: DC
  Administered 2021-05-20 – 2021-05-21 (×2): 160 mg via ORAL
  Filled 2021-05-20 (×2): qty 1

## 2021-05-20 MED ORDER — LOSARTAN POTASSIUM 50 MG PO TABS
50.0000 mg | ORAL_TABLET | Freq: Every day | ORAL | Status: DC
  Administered 2021-05-20 – 2021-05-21 (×2): 50 mg via ORAL
  Filled 2021-05-20 (×2): qty 1

## 2021-05-20 MED ORDER — AMOXICILLIN-POT CLAVULANATE 875-125 MG PO TABS
1.0000 | ORAL_TABLET | Freq: Two times a day (BID) | ORAL | Status: DC
  Administered 2021-05-20 – 2021-05-21 (×2): 1 via ORAL
  Filled 2021-05-20 (×3): qty 1

## 2021-05-20 NOTE — ED Notes (Signed)
IVC/pending psych consult 

## 2021-05-20 NOTE — ED Notes (Signed)
Meds verified with Levada Dy, RN. Taken to pharmacy by pharmacy tech. Chart copy placed on chart.

## 2021-05-20 NOTE — ED Notes (Signed)
Meal tray placed at bedside. Pt is sleeping at this time.

## 2021-05-20 NOTE — ED Notes (Signed)
Notified dr. Cherylann Banas of pt's 2135 blood sugar value

## 2021-05-20 NOTE — ED Provider Notes (Signed)
-----------------------------------------   8:37 PM on 05/20/2021 -----------------------------------------  [facility] telemetry psychiatrist recommends inpatient admission and advises that the patient continues to meet criteria for IVC.  Medication regimen changes have been recommended and I have ordered these changes as well as the patient's other daily medications.   Arta Silence, MD 05/20/21 2037

## 2021-05-20 NOTE — ED Notes (Signed)
Pt has dog at home. Sheriff spoke with [employer] officer Reynolds Bowl [phone] about picking up dog tomorrow (family friend of pt and also who gave pt the dog). Keys taken out of belongings and labeled with pt sticker and Deborah's contact information and left at the quad desk.

## 2021-05-20 NOTE — Discharge Instructions (Addendum)
Cleared by psychiatry for discharge home. 

## 2021-05-20 NOTE — ED Notes (Signed)
Belongings:  Navy pants Black lacy tank top Pink sports bra Rosary bead necklace Bag of Meds
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
## 2021-05-21 DIAGNOSIS — F250 Schizoaffective disorder, bipolar type: Secondary | ICD-10-CM | POA: Diagnosis not present

## 2021-05-21 DIAGNOSIS — E119 Type 2 diabetes mellitus without complications: Secondary | ICD-10-CM | POA: Diagnosis not present

## 2021-05-21 DIAGNOSIS — F17210 Nicotine dependence, cigarettes, uncomplicated: Secondary | ICD-10-CM | POA: Diagnosis not present

## 2021-05-21 DIAGNOSIS — M5137 Other intervertebral disc degeneration, lumbosacral region: Secondary | ICD-10-CM | POA: Diagnosis not present

## 2021-05-21 DIAGNOSIS — I10 Essential (primary) hypertension: Secondary | ICD-10-CM | POA: Diagnosis not present

## 2021-05-21 DIAGNOSIS — F17200 Nicotine dependence, unspecified, uncomplicated: Secondary | ICD-10-CM | POA: Diagnosis not present

## 2021-05-21 DIAGNOSIS — F14922 Cocaine use, unspecified with intoxication with perceptual disturbance: Secondary | ICD-10-CM | POA: Diagnosis not present

## 2021-05-21 DIAGNOSIS — Z79899 Other long term (current) drug therapy: Secondary | ICD-10-CM | POA: Diagnosis not present

## 2021-05-21 DIAGNOSIS — M542 Cervicalgia: Secondary | ICD-10-CM | POA: Diagnosis not present

## 2021-05-21 DIAGNOSIS — G894 Chronic pain syndrome: Secondary | ICD-10-CM | POA: Diagnosis not present

## 2021-05-21 DIAGNOSIS — Z6835 Body mass index (BMI) 35.0-35.9, adult: Secondary | ICD-10-CM | POA: Diagnosis not present

## 2021-05-21 LAB — SARS CORONAVIRUS 2 (TAT 6-24 HRS): SARS Coronavirus 2: NEGATIVE

## 2021-05-21 LAB — CBG MONITORING, ED: Glucose-Capillary: 377 mg/dL — ABNORMAL HIGH (ref 70–99)

## 2021-05-21 MED ORDER — ALBUTEROL SULFATE HFA 108 (90 BASE) MCG/ACT IN AERS
2.0000 | INHALATION_SPRAY | RESPIRATORY_TRACT | Status: DC | PRN
  Filled 2021-05-21: qty 6.7

## 2021-05-21 MED ORDER — INSULIN ASPART 100 UNIT/ML IJ SOLN
0.0000 [IU] | Freq: Every day | INTRAMUSCULAR | Status: DC
Start: 2021-05-21 — End: 2021-05-21

## 2021-05-21 MED ORDER — INSULIN ASPART 100 UNIT/ML IJ SOLN
0.0000 [IU] | Freq: Three times a day (TID) | INTRAMUSCULAR | Status: DC
  Filled 2021-05-21: qty 1

## 2021-05-21 NOTE — Consult Note (Signed)
Please note, full note to follow.  Patient seen and chart reviewed.  Patient no longer meets involuntary commitment criteria and does not require inpatient hospital level treatment.  Discontinued IVC.  Case reviewed with ER physician.  Recommend discharge to outpatient treatment

## 2021-05-21 NOTE — ED Notes (Signed)
Pt discharged home.  All belongings returned to patient including mediation from pharmacy.  Pt denies SI. Discharge instructions reviewed with patient.
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
## 2021-05-21 NOTE — ED Provider Notes (Signed)
Cleared by psychiatry for dc home.    Vanessa [place], MD 05/21/21 [phone]

## 2021-05-21 NOTE — ED Provider Notes (Signed)
Emergency Medicine Observation Re-evaluation Note  Colleen Mejia is a 50 y.o. female, seen on rounds today.  Pt initially presented to the ED for complaints of No chief complaint on file. Currently, the patient is resting.  Physical Exam  BP 136/77   Pulse 90   Temp 98.1 F (36.7 C) (Oral)   Resp 18   Ht 1.626 m (5\' 4" )   Wt 95.3 kg   SpO2 95%   BMI 36.05 kg/m  Physical Exam General: No acute distress Cardiac: Good peripheral perfusion Lungs: Breathing easily and comfortably Psych: No behavioral issues currently  ED Course / MDM  EKG:   I have reviewed the labs performed to date as well as medications administered while in observation.  Recent changes in the last 24 hours include evaluation by telepsychiatry who recommended some medication changes (orders placed by Dr. Cherylann Banas) and inpatient psychiatric placement.  Plan  Current plan is for inpatient psychiatry care. Patient is under full IVC at this time.   Hinda Kehr, MD 05/21/21 [phone]

## 2021-05-21 NOTE — ED Notes (Signed)
Pt given clothes to dress for discharge.

## 2021-05-21 NOTE — ED Notes (Signed)
IVC PAPERS  RESCINDED  PER  DR  CLAPACS  MD  INFORMED  AMY  B  RN

## 2021-05-21 NOTE — Progress Notes (Signed)
Inpatient Diabetes Program Recommendations  AACE/ADA: New Consensus Statement on Inpatient Glycemic Control   Target Ranges:  Prepandial:   less than 140 mg/dL      Peak postprandial:   less than 180 mg/dL (1-2 hours)      Critically ill patients:  140 - 180 mg/dL  Results for EDLA, PARA (MRN [id number]) as of 05/21/2021 10:32  Ref. Range 05/20/2021 14:58 05/20/2021 21:35  Glucose-Capillary Latest Ref Range: 70 - 99 mg/dL 427 (H) 236 (H)    Review of Glycemic Control  Diabetes history: DM2 Outpatient Diabetes medications: Amaryl 4 mg daily, Ozempic 1 mg Qweek Current orders for Inpatient glycemic control: Amaryl 4 mg daily  Inpatient Diabetes Program Recommendations:    Insulin: While holding in the Emergency Department, please order CBGs AC&HS and Novolog 0-15 units TID with meals and Novolog 0-5 units QHS.  Thanks, Barnie Alderman, RN, MSN, CDE Diabetes Coordinator Inpatient Diabetes Program [phone] (Team Pager from 8am to 5pm)

## 2021-05-21 NOTE — ED Notes (Signed)
Report to ally, rn.  

## 2021-06-12 DIAGNOSIS — G894 Chronic pain syndrome: Secondary | ICD-10-CM | POA: Diagnosis not present

## 2021-06-12 DIAGNOSIS — Z79899 Other long term (current) drug therapy: Secondary | ICD-10-CM | POA: Diagnosis not present

## 2021-06-12 DIAGNOSIS — M542 Cervicalgia: Secondary | ICD-10-CM | POA: Diagnosis not present

## 2021-06-15 DIAGNOSIS — R6889 Other general symptoms and signs: Secondary | ICD-10-CM | POA: Diagnosis not present

## 2021-06-15 DIAGNOSIS — R1084 Generalized abdominal pain: Secondary | ICD-10-CM | POA: Diagnosis not present

## 2021-06-15 DIAGNOSIS — K2270 Barrett's esophagus without dysplasia: Secondary | ICD-10-CM | POA: Diagnosis not present

## 2021-06-21 DIAGNOSIS — R6889 Other general symptoms and signs: Secondary | ICD-10-CM | POA: Diagnosis not present

## 2021-07-06 DIAGNOSIS — I10 Essential (primary) hypertension: Secondary | ICD-10-CM | POA: Diagnosis not present

## 2021-07-06 DIAGNOSIS — Z6835 Body mass index (BMI) 35.0-35.9, adult: Secondary | ICD-10-CM | POA: Diagnosis not present

## 2021-07-06 DIAGNOSIS — Z79899 Other long term (current) drug therapy: Secondary | ICD-10-CM | POA: Diagnosis not present

## 2021-07-06 DIAGNOSIS — R6889 Other general symptoms and signs: Secondary | ICD-10-CM | POA: Diagnosis not present

## 2021-07-06 DIAGNOSIS — E119 Type 2 diabetes mellitus without complications: Secondary | ICD-10-CM | POA: Diagnosis not present

## 2021-07-06 DIAGNOSIS — F17200 Nicotine dependence, unspecified, uncomplicated: Secondary | ICD-10-CM | POA: Diagnosis not present

## 2021-07-06 DIAGNOSIS — M542 Cervicalgia: Secondary | ICD-10-CM | POA: Diagnosis not present

## 2021-07-06 DIAGNOSIS — F17210 Nicotine dependence, cigarettes, uncomplicated: Secondary | ICD-10-CM | POA: Diagnosis not present

## 2021-07-06 DIAGNOSIS — G894 Chronic pain syndrome: Secondary | ICD-10-CM | POA: Diagnosis not present

## 2021-07-06 DIAGNOSIS — M5137 Other intervertebral disc degeneration, lumbosacral region: Secondary | ICD-10-CM | POA: Diagnosis not present

## 2021-07-13 DIAGNOSIS — G894 Chronic pain syndrome: Secondary | ICD-10-CM | POA: Diagnosis not present

## 2021-07-13 DIAGNOSIS — M542 Cervicalgia: Secondary | ICD-10-CM | POA: Diagnosis not present

## 2021-07-13 DIAGNOSIS — Z79899 Other long term (current) drug therapy: Secondary | ICD-10-CM | POA: Diagnosis not present

## 2021-07-17 DIAGNOSIS — R6889 Other general symptoms and signs: Secondary | ICD-10-CM | POA: Diagnosis not present

## 2021-07-17 DIAGNOSIS — F17210 Nicotine dependence, cigarettes, uncomplicated: Secondary | ICD-10-CM | POA: Diagnosis not present

## 2021-07-17 DIAGNOSIS — J209 Acute bronchitis, unspecified: Secondary | ICD-10-CM | POA: Diagnosis not present

## 2021-07-17 DIAGNOSIS — F319 Bipolar disorder, unspecified: Secondary | ICD-10-CM | POA: Diagnosis not present

## 2021-07-17 DIAGNOSIS — K219 Gastro-esophageal reflux disease without esophagitis: Secondary | ICD-10-CM | POA: Diagnosis not present

## 2021-07-17 DIAGNOSIS — E1165 Type 2 diabetes mellitus with hyperglycemia: Secondary | ICD-10-CM | POA: Diagnosis not present

## 2021-07-17 DIAGNOSIS — R42 Dizziness and giddiness: Secondary | ICD-10-CM | POA: Diagnosis not present

## 2021-07-17 DIAGNOSIS — J4530 Mild persistent asthma, uncomplicated: Secondary | ICD-10-CM | POA: Diagnosis not present

## 2021-07-17 DIAGNOSIS — Z23 Encounter for immunization: Secondary | ICD-10-CM | POA: Diagnosis not present

## 2021-07-17 DIAGNOSIS — R110 Nausea: Secondary | ICD-10-CM | POA: Diagnosis not present

## 2021-07-17 DIAGNOSIS — M797 Fibromyalgia: Secondary | ICD-10-CM | POA: Diagnosis not present

## 2021-07-17 DIAGNOSIS — F331 Major depressive disorder, recurrent, moderate: Secondary | ICD-10-CM | POA: Diagnosis not present

## 2021-07-17 DIAGNOSIS — J440 Chronic obstructive pulmonary disease with acute lower respiratory infection: Secondary | ICD-10-CM | POA: Diagnosis not present

## 2021-07-17 DIAGNOSIS — E119 Type 2 diabetes mellitus without complications: Secondary | ICD-10-CM | POA: Diagnosis not present

## 2021-07-17 DIAGNOSIS — R0609 Other forms of dyspnea: Secondary | ICD-10-CM | POA: Diagnosis not present

## 2021-07-17 DIAGNOSIS — F32A Depression, unspecified: Secondary | ICD-10-CM | POA: Diagnosis not present

## 2021-07-26 DIAGNOSIS — F411 Generalized anxiety disorder: Secondary | ICD-10-CM | POA: Diagnosis not present

## 2021-07-26 DIAGNOSIS — Z008 Encounter for other general examination: Secondary | ICD-10-CM | POA: Diagnosis not present

## 2021-07-26 DIAGNOSIS — F410 Panic disorder [episodic paroxysmal anxiety] without agoraphobia: Secondary | ICD-10-CM | POA: Diagnosis not present

## 2021-07-26 DIAGNOSIS — F3160 Bipolar disorder, current episode mixed, unspecified: Secondary | ICD-10-CM | POA: Diagnosis not present

## 2021-07-26 DIAGNOSIS — R6889 Other general symptoms and signs: Secondary | ICD-10-CM | POA: Diagnosis not present

## 2021-08-12 DIAGNOSIS — M542 Cervicalgia: Secondary | ICD-10-CM | POA: Diagnosis not present

## 2021-08-12 DIAGNOSIS — Z79899 Other long term (current) drug therapy: Secondary | ICD-10-CM | POA: Diagnosis not present

## 2021-08-12 DIAGNOSIS — G894 Chronic pain syndrome: Secondary | ICD-10-CM | POA: Diagnosis not present

## 2021-08-16 DIAGNOSIS — G894 Chronic pain syndrome: Secondary | ICD-10-CM | POA: Diagnosis not present

## 2021-08-16 DIAGNOSIS — M542 Cervicalgia: Secondary | ICD-10-CM | POA: Diagnosis not present

## 2021-08-16 DIAGNOSIS — Z6835 Body mass index (BMI) 35.0-35.9, adult: Secondary | ICD-10-CM | POA: Diagnosis not present

## 2021-08-16 DIAGNOSIS — F17200 Nicotine dependence, unspecified, uncomplicated: Secondary | ICD-10-CM | POA: Diagnosis not present

## 2021-08-16 DIAGNOSIS — E119 Type 2 diabetes mellitus without complications: Secondary | ICD-10-CM | POA: Diagnosis not present

## 2021-08-16 DIAGNOSIS — Z79899 Other long term (current) drug therapy: Secondary | ICD-10-CM | POA: Diagnosis not present

## 2021-08-16 DIAGNOSIS — I10 Essential (primary) hypertension: Secondary | ICD-10-CM | POA: Diagnosis not present

## 2021-08-16 DIAGNOSIS — F17210 Nicotine dependence, cigarettes, uncomplicated: Secondary | ICD-10-CM | POA: Diagnosis not present

## 2021-08-16 DIAGNOSIS — R6889 Other general symptoms and signs: Secondary | ICD-10-CM | POA: Diagnosis not present

## 2021-08-19 DIAGNOSIS — Z79899 Other long term (current) drug therapy: Secondary | ICD-10-CM | POA: Diagnosis not present

## 2021-09-05 ENCOUNTER — Other Ambulatory Visit: Payer: Self-pay | Admitting: Internal Medicine

## 2021-09-05 DIAGNOSIS — Z1231 Encounter for screening mammogram for malignant neoplasm of breast: Secondary | ICD-10-CM

## 2021-09-12 DIAGNOSIS — G894 Chronic pain syndrome: Secondary | ICD-10-CM | POA: Diagnosis not present

## 2021-09-12 DIAGNOSIS — Z79899 Other long term (current) drug therapy: Secondary | ICD-10-CM | POA: Diagnosis not present

## 2021-09-12 DIAGNOSIS — M542 Cervicalgia: Secondary | ICD-10-CM | POA: Diagnosis not present

## 2021-09-22 IMAGING — CR DG CHEST 2V
1 series · 2 of 2 positions shown · non-contrast
Comparison: None.

CLINICAL DATA: Chest pain

EXAM:
CHEST - 2 VIEW

[Series 1: dg chest 2 view · 0.14mm/px · 2 of 2 slices shown]
[im 1/2]
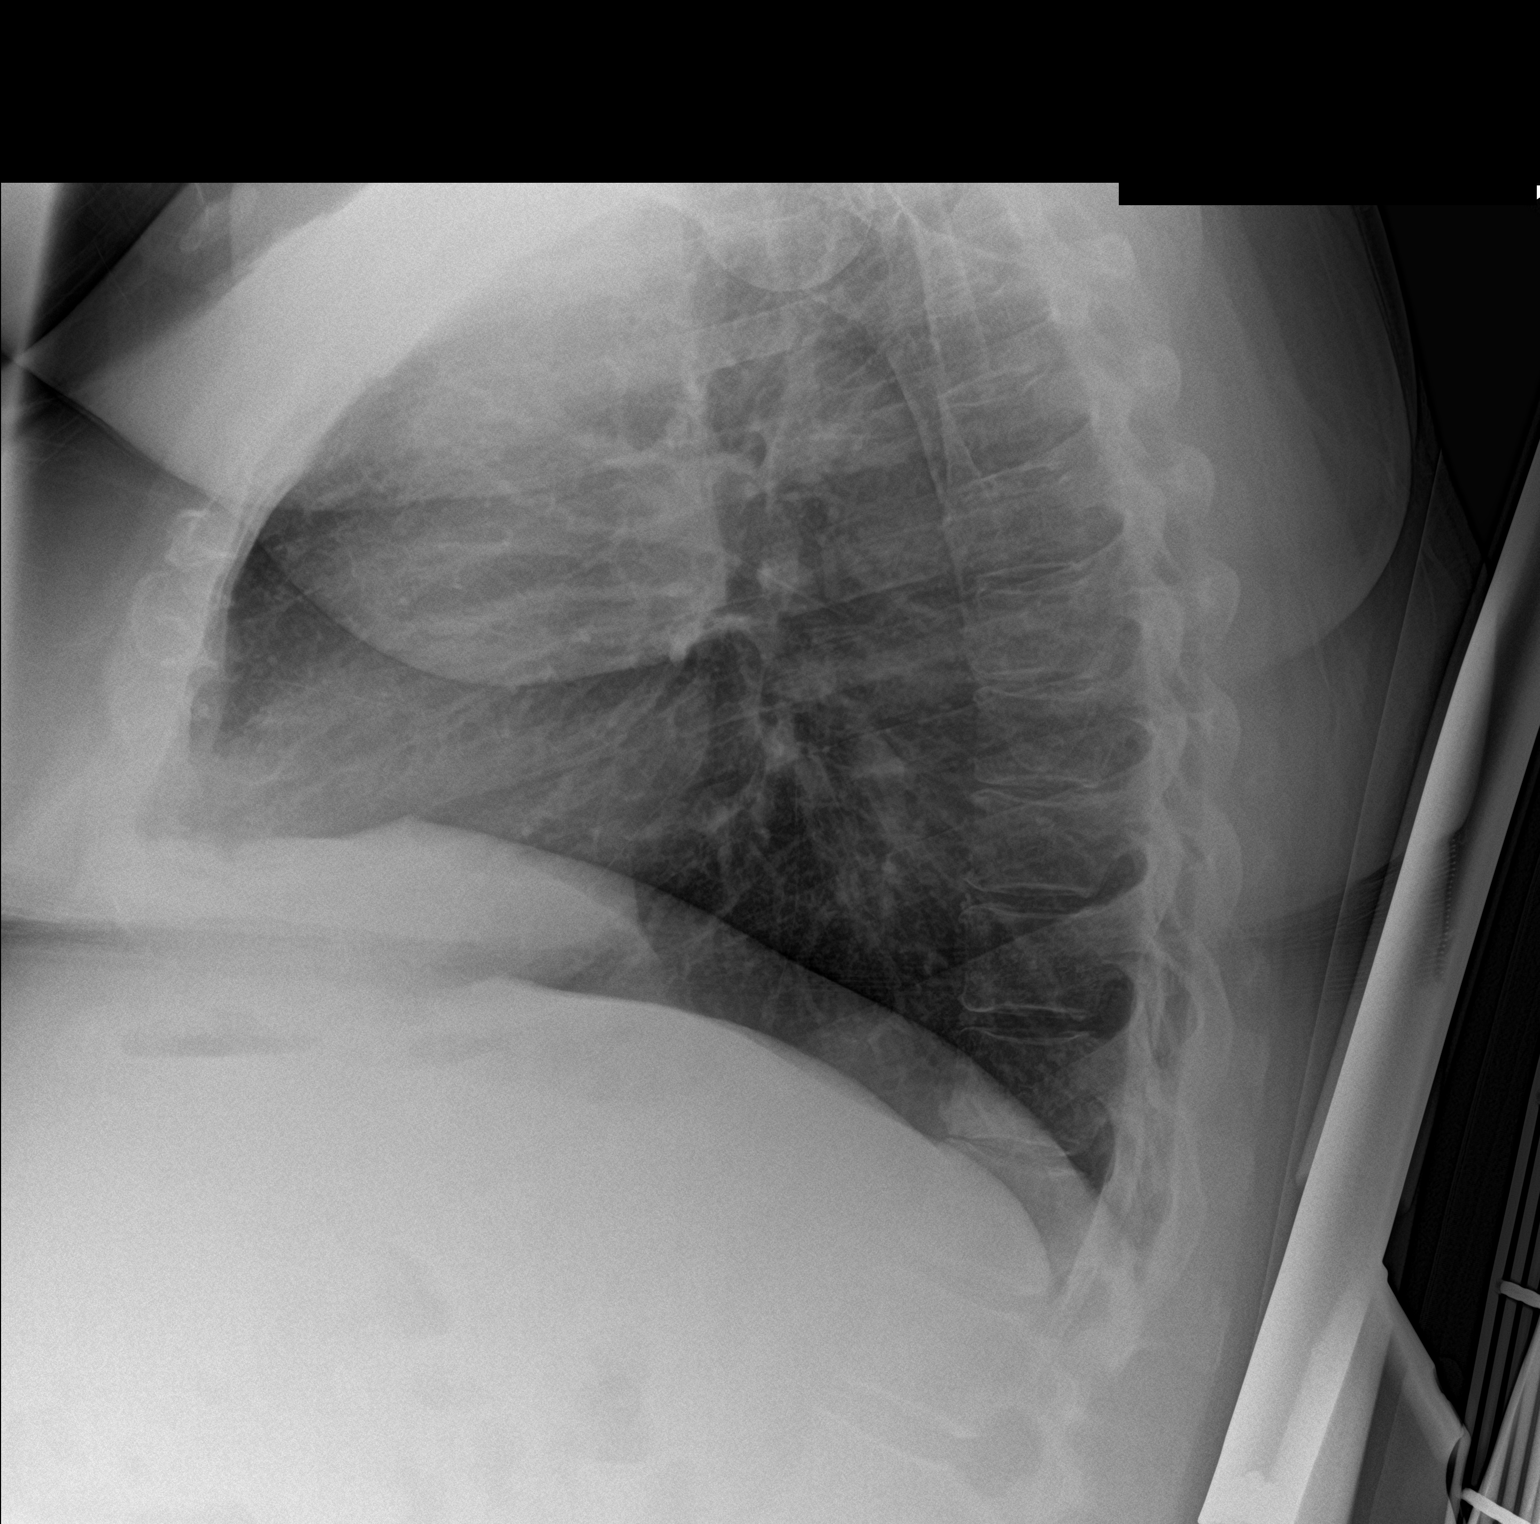
[im 2/2]
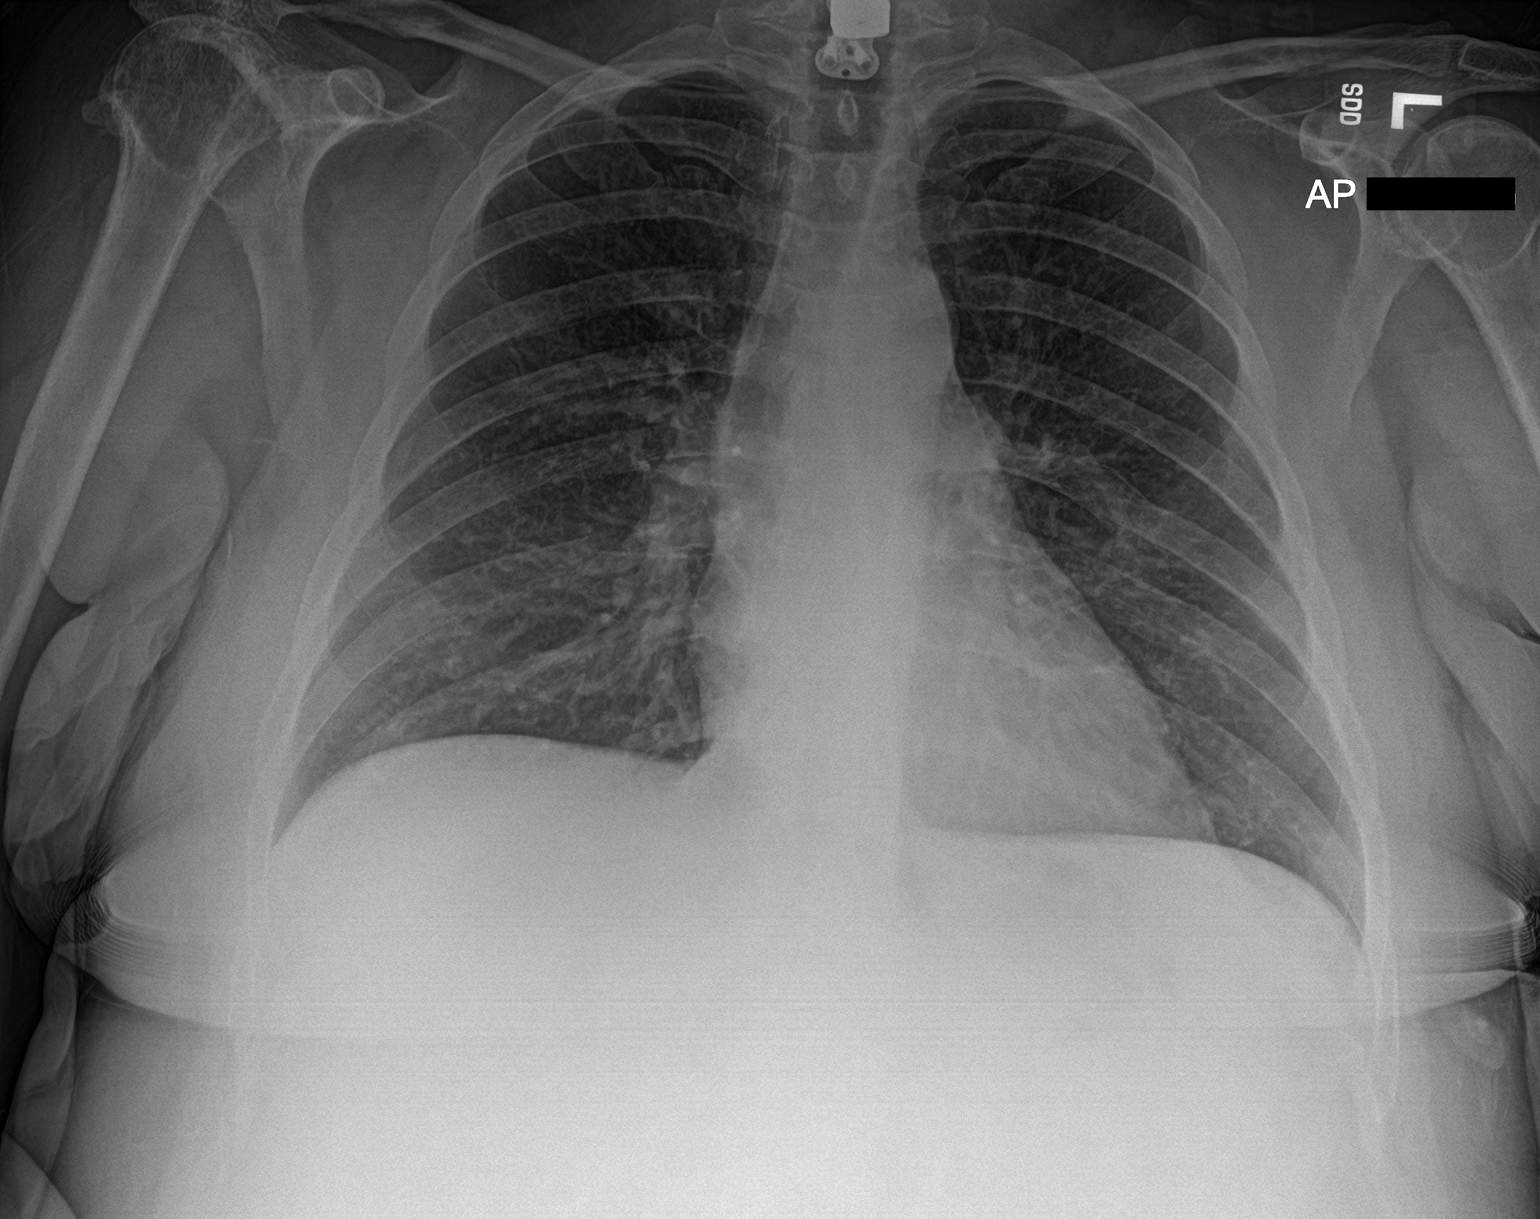

[2 of 2 positions shown; findings below may reference images not displayed]

FINDINGS: Lungs are well expanded, symmetric, and clear. No pneumothorax or
pleural effusion. Cardiac size within normal limits. Pulmonary
vascularity is normal. Remote seventh rib fracture noted
posteriorly, unchanged. Cervical fusion hardware partially
visualized. No acute bone abnormality.
IMPRESSION: No active cardiopulmonary disease.

## 2021-10-07 IMAGING — CT CT CHEST W/ CM
2 of 3 series · 15 of 36 positions shown, 18 images · IV contrast (omnipaque)
Comparison: None.

CLINICAL DATA: Chest pain and palpitations.

EXAM:
CT CHEST WITH CONTRAST
TECHNIQUE: Multidetector CT imaging of the chest was performed during
intravenous contrast administration.
CONTRAST:  75mL OMNIPAQUE IOHEXOL 300 MG/ML  SOLN

[Series 2: axial st · axial · 0.69mm/px · z∈[-320,-44]mm · 12 of 162 slices shown, 15 images]
[im 12/162  mediastinal]
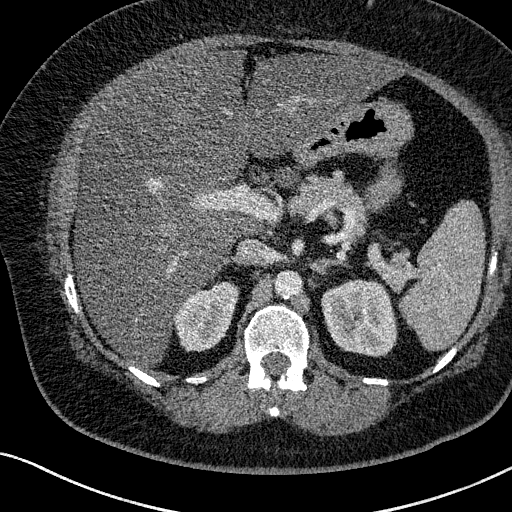
[im 12/162  lung]
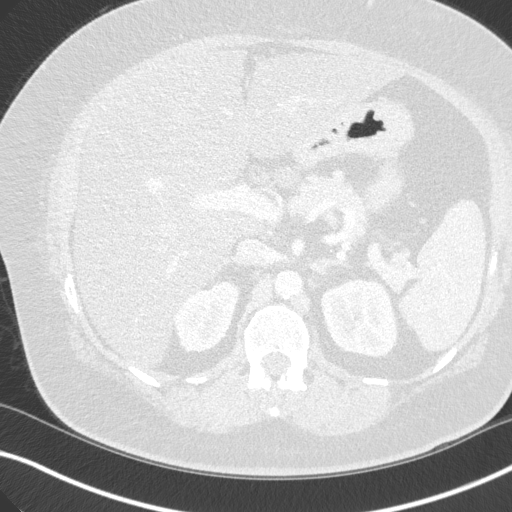
[im 24/162  lung]
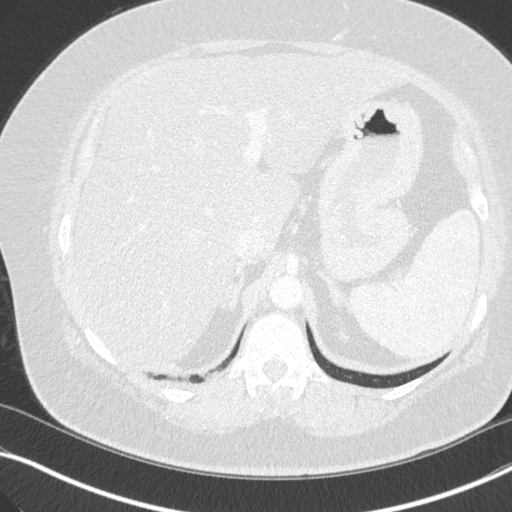
[im 36/162  lung]
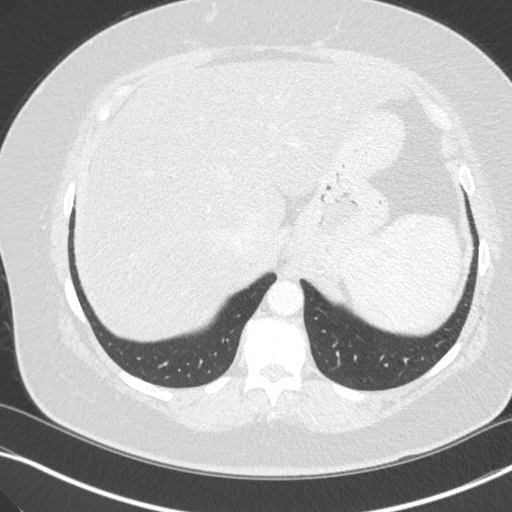
[im 48/162  lung]
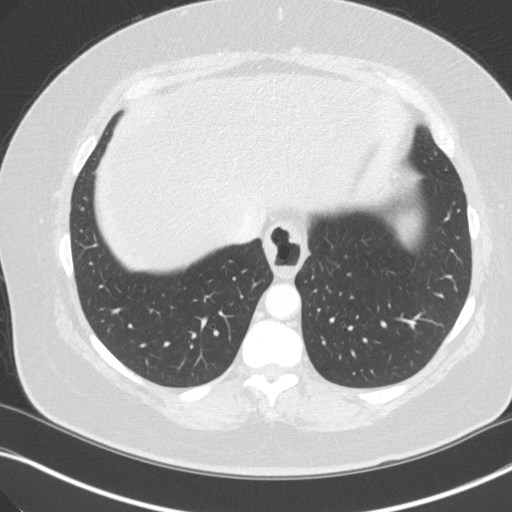
[im 60/162  mediastinal]
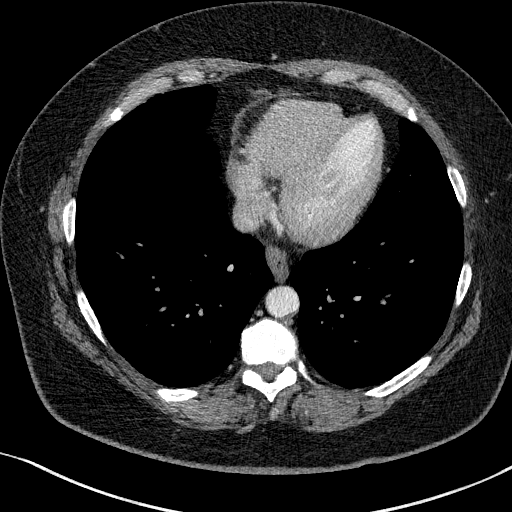
[im 60/162  lung]
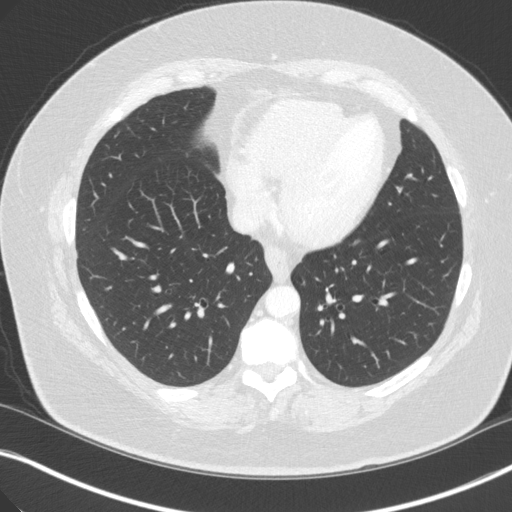
[im 72/162  lung]
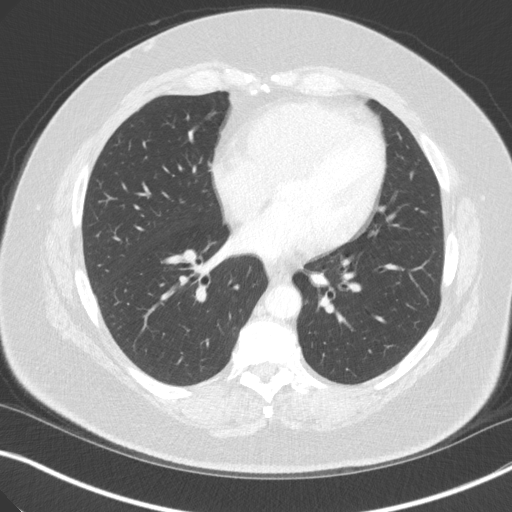
[im 90/162  lung]
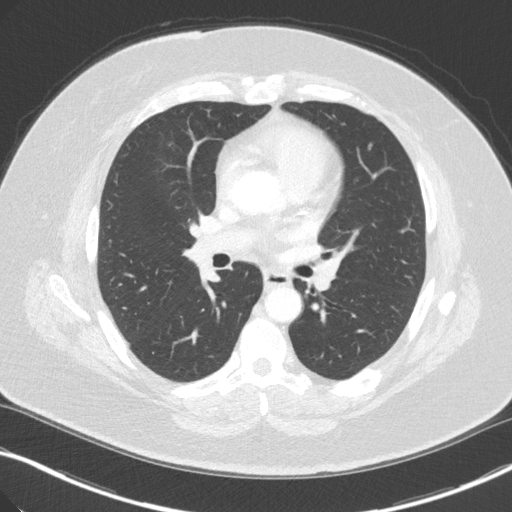
[im 102/162  lung]
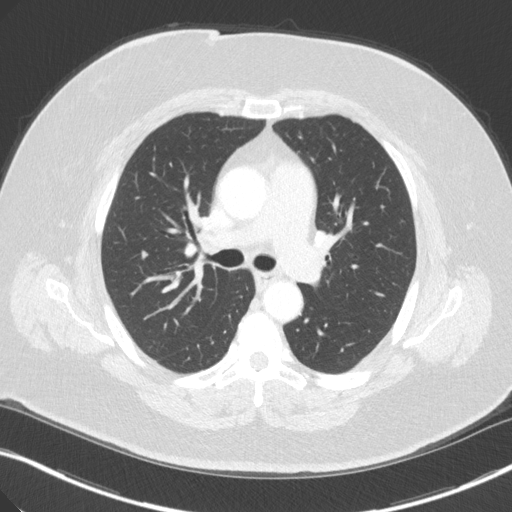
[im 114/162  mediastinal]
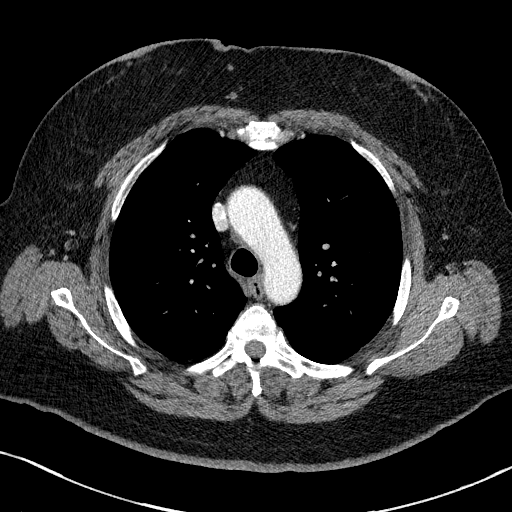
[im 114/162  lung]
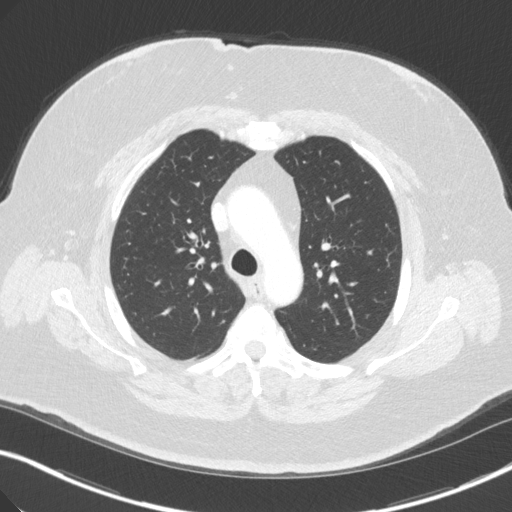
[im 126/162  lung]
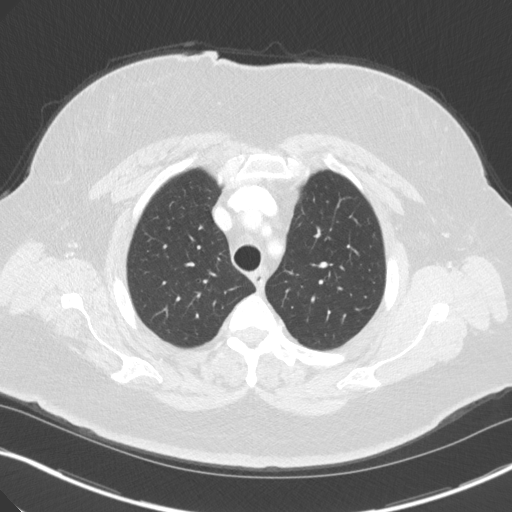
[im 138/162  lung]
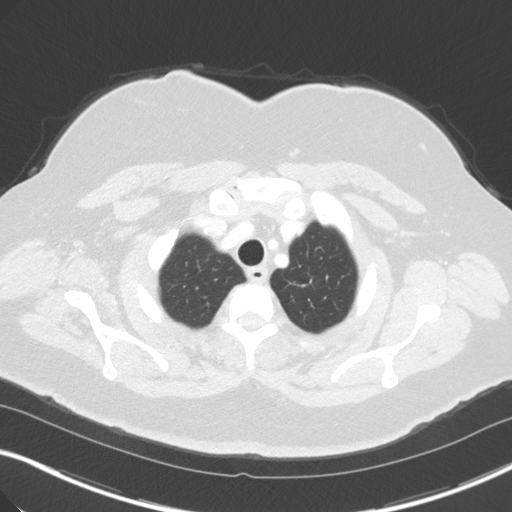
[im 150/162  lung]
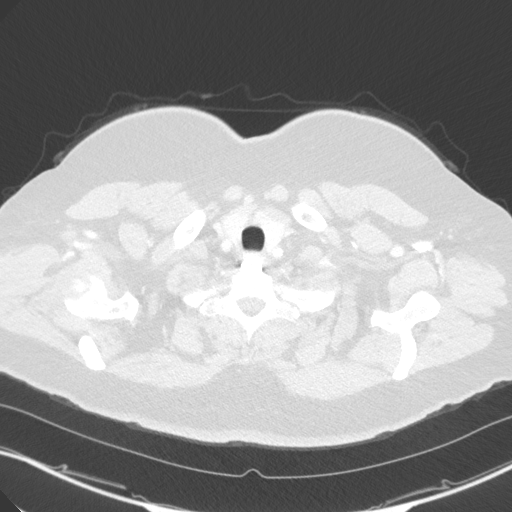

[Series 5: coronal · coronal · 0.64mm/px · 3 of 176 slices shown]
[im 36/176  lung]
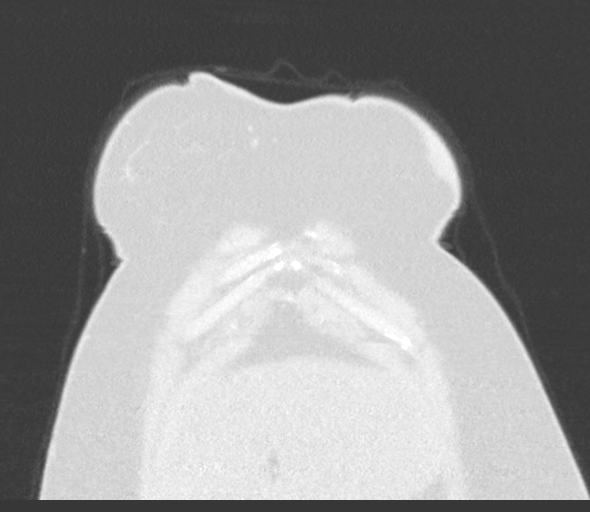
[im 71/176  lung]
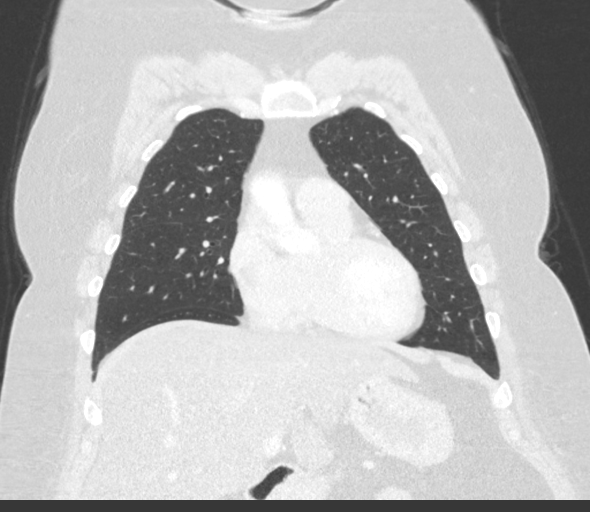
[im 106/176  lung]
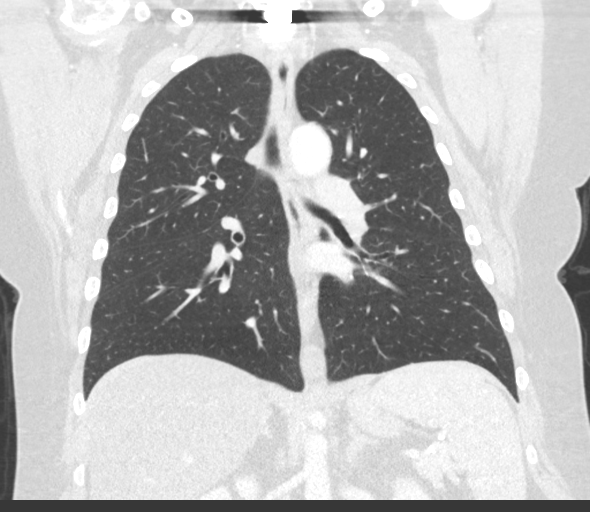

[15 of 36 positions shown; findings below may reference images not displayed]

FINDINGS: Cardiovascular: Heart is normal size. Calcified plaque over the left
anterior descending coronary artery. Thoracic aorta is normal in
caliber. Remaining vascular structures are unremarkable.

Mediastinum/Nodes: No evidence of mediastinal or hilar adenopathy.
Small sliding hiatal hernia. Remaining mediastinal structures are
normal.

Lungs/Pleura: Lungs are adequately inflated without focal lobar
consolidation or effusion. Airways are normal.

Upper Abdomen: Diffuse low-attenuation of the liver without focal
mass compatible with a degree of steatosis. No acute findings.
Minimal calcified plaque over the abdominal aorta.

Musculoskeletal: Minimal degenerative change of the spine.
IMPRESSION: 1. No acute findings in the chest.
2. Minimal atherosclerotic coronary artery disease.
3. Small sliding hiatal hernia.
4. Aortic atherosclerosis.

Aortic Atherosclerosis ([id number]).

## 2021-10-12 DIAGNOSIS — M542 Cervicalgia: Secondary | ICD-10-CM | POA: Diagnosis not present

## 2021-10-12 DIAGNOSIS — G894 Chronic pain syndrome: Secondary | ICD-10-CM | POA: Diagnosis not present

## 2021-10-12 DIAGNOSIS — Z79899 Other long term (current) drug therapy: Secondary | ICD-10-CM | POA: Diagnosis not present

## 2021-10-22 DIAGNOSIS — F419 Anxiety disorder, unspecified: Secondary | ICD-10-CM | POA: Diagnosis not present

## 2021-10-22 DIAGNOSIS — K580 Irritable bowel syndrome with diarrhea: Secondary | ICD-10-CM | POA: Diagnosis not present

## 2021-10-22 DIAGNOSIS — R6889 Other general symptoms and signs: Secondary | ICD-10-CM | POA: Diagnosis not present

## 2021-10-22 DIAGNOSIS — I10 Essential (primary) hypertension: Secondary | ICD-10-CM | POA: Diagnosis not present

## 2021-10-22 DIAGNOSIS — F319 Bipolar disorder, unspecified: Secondary | ICD-10-CM | POA: Diagnosis not present

## 2021-10-22 DIAGNOSIS — J4530 Mild persistent asthma, uncomplicated: Secondary | ICD-10-CM | POA: Diagnosis not present

## 2021-10-22 DIAGNOSIS — F339 Major depressive disorder, recurrent, unspecified: Secondary | ICD-10-CM | POA: Diagnosis not present

## 2021-10-22 DIAGNOSIS — M545 Low back pain, unspecified: Secondary | ICD-10-CM | POA: Diagnosis not present

## 2021-10-22 DIAGNOSIS — J449 Chronic obstructive pulmonary disease, unspecified: Secondary | ICD-10-CM | POA: Diagnosis not present

## 2021-10-22 DIAGNOSIS — F331 Major depressive disorder, recurrent, moderate: Secondary | ICD-10-CM | POA: Diagnosis not present

## 2021-10-22 DIAGNOSIS — K219 Gastro-esophageal reflux disease without esophagitis: Secondary | ICD-10-CM | POA: Diagnosis not present

## 2021-10-22 DIAGNOSIS — E1165 Type 2 diabetes mellitus with hyperglycemia: Secondary | ICD-10-CM | POA: Diagnosis not present

## 2021-10-22 DIAGNOSIS — G8929 Other chronic pain: Secondary | ICD-10-CM | POA: Diagnosis not present

## 2021-10-22 DIAGNOSIS — M79601 Pain in right arm: Secondary | ICD-10-CM | POA: Diagnosis not present

## 2021-10-22 DIAGNOSIS — F17210 Nicotine dependence, cigarettes, uncomplicated: Secondary | ICD-10-CM | POA: Diagnosis not present

## 2021-10-22 DIAGNOSIS — R200 Anesthesia of skin: Secondary | ICD-10-CM | POA: Diagnosis not present

## 2021-10-22 DIAGNOSIS — M797 Fibromyalgia: Secondary | ICD-10-CM | POA: Diagnosis not present

## 2021-11-02 DIAGNOSIS — R6889 Other general symptoms and signs: Secondary | ICD-10-CM | POA: Diagnosis not present

## 2021-11-02 DIAGNOSIS — F339 Major depressive disorder, recurrent, unspecified: Secondary | ICD-10-CM | POA: Diagnosis not present

## 2021-11-02 DIAGNOSIS — R5381 Other malaise: Secondary | ICD-10-CM | POA: Diagnosis not present

## 2021-11-02 DIAGNOSIS — M797 Fibromyalgia: Secondary | ICD-10-CM | POA: Diagnosis not present

## 2021-11-02 DIAGNOSIS — R531 Weakness: Secondary | ICD-10-CM | POA: Diagnosis not present

## 2021-11-02 DIAGNOSIS — Z03818 Encounter for observation for suspected exposure to other biological agents ruled out: Secondary | ICD-10-CM | POA: Diagnosis not present

## 2021-11-02 DIAGNOSIS — E119 Type 2 diabetes mellitus without complications: Secondary | ICD-10-CM | POA: Diagnosis not present

## 2021-11-02 DIAGNOSIS — F331 Major depressive disorder, recurrent, moderate: Secondary | ICD-10-CM | POA: Diagnosis not present

## 2021-11-02 DIAGNOSIS — G4710 Hypersomnia, unspecified: Secondary | ICD-10-CM | POA: Diagnosis not present

## 2021-11-02 DIAGNOSIS — R5383 Other fatigue: Secondary | ICD-10-CM | POA: Diagnosis not present

## 2021-11-02 DIAGNOSIS — I10 Essential (primary) hypertension: Secondary | ICD-10-CM | POA: Diagnosis not present

## 2021-11-02 DIAGNOSIS — Z6834 Body mass index (BMI) 34.0-34.9, adult: Secondary | ICD-10-CM | POA: Diagnosis not present

## 2021-11-02 DIAGNOSIS — J449 Chronic obstructive pulmonary disease, unspecified: Secondary | ICD-10-CM | POA: Diagnosis not present

## 2021-12-07 ENCOUNTER — Other Ambulatory Visit: Payer: Self-pay | Admitting: Orthopaedic Surgery

## 2021-12-07 DIAGNOSIS — M542 Cervicalgia: Secondary | ICD-10-CM

## 2021-12-07 DIAGNOSIS — M5416 Radiculopathy, lumbar region: Secondary | ICD-10-CM

## 2021-12-07 DIAGNOSIS — M545 Low back pain, unspecified: Secondary | ICD-10-CM

## 2021-12-07 DIAGNOSIS — M5412 Radiculopathy, cervical region: Secondary | ICD-10-CM

## 2022-01-31 ENCOUNTER — Other Ambulatory Visit: Payer: Self-pay

## 2022-01-31 ENCOUNTER — Emergency Department
Admission: EM | Admit: 2022-01-31 | Discharge: 2022-01-31 | Disposition: A | Discharge status: Home / Self Care | Payer: Medicare Other | Attending: Emergency Medicine | Admitting: Emergency Medicine

## 2022-01-31 ENCOUNTER — Emergency Department: Payer: Medicare Other

## 2022-01-31 DIAGNOSIS — E039 Hypothyroidism, unspecified: Secondary | ICD-10-CM | POA: Insufficient documentation

## 2022-01-31 DIAGNOSIS — R140 Abdominal distension (gaseous): Secondary | ICD-10-CM | POA: Diagnosis not present

## 2022-01-31 DIAGNOSIS — E1165 Type 2 diabetes mellitus with hyperglycemia: Secondary | ICD-10-CM | POA: Diagnosis not present

## 2022-01-31 DIAGNOSIS — R110 Nausea: Secondary | ICD-10-CM | POA: Diagnosis not present

## 2022-01-31 DIAGNOSIS — R109 Unspecified abdominal pain: Secondary | ICD-10-CM | POA: Diagnosis present

## 2022-01-31 DIAGNOSIS — R1084 Generalized abdominal pain: Secondary | ICD-10-CM

## 2022-01-31 LAB — CBC
HCT: 42.9 % (ref 36.0–46.0)
Hemoglobin: 14.2 g/dL (ref 12.0–15.0)
MCH: 28.7 pg (ref 26.0–34.0)
MCHC: 33.1 g/dL (ref 30.0–36.0)
MCV: 86.7 fL (ref 80.0–100.0)
Platelets: 239 10*3/uL (ref 150–400)
RBC: 4.95 MIL/uL (ref 3.87–5.11)
RDW: 12.9 % (ref 11.5–15.5)
WBC: 7.7 10*3/uL (ref 4.0–10.5)
nRBC: 0 % (ref 0.0–0.2)

## 2022-01-31 LAB — URINALYSIS, ROUTINE W REFLEX MICROSCOPIC
Bacteria, UA: NONE SEEN
Bilirubin Urine: NEGATIVE
Glucose, UA: >=500 mg/dL — AB
Hgb urine dipstick: NEGATIVE
Ketones, ur: NEGATIVE mg/dL
Nitrite: NEGATIVE
Protein, ur: NEGATIVE mg/dL
Specific Gravity, Urine: 1.021 (ref 1.005–1.030)
pH: 5 (ref 5.0–8.0)

## 2022-01-31 LAB — COMPREHENSIVE METABOLIC PANEL
ALT: 45 U/L — ABNORMAL HIGH (ref 0–44)
AST: 29 U/L (ref 15–41)
Albumin: 4.5 g/dL (ref 3.5–5.0)
Alkaline Phosphatase: 55 U/L (ref 38–126)
Anion gap: 11 (ref 5–15)
BUN: 18 mg/dL (ref 6–20)
CO2: 22 mmol/L (ref 22–32)
Calcium: 9.6 mg/dL (ref 8.9–10.3)
Chloride: 103 mmol/L (ref 98–111)
Creatinine, Ser: 0.79 mg/dL (ref 0.44–1.00)
GFR, Estimated: >60 mL/min (ref >60)
Glucose, Bld: 258 mg/dL — ABNORMAL HIGH (ref 70–99)
Potassium: 4.3 mmol/L (ref 3.5–5.1)
Sodium: 136 mmol/L (ref 135–145)
Total Bilirubin: 0.5 mg/dL (ref 0.3–1.2)
Total Protein: 7.9 g/dL (ref 6.5–8.1)

## 2022-01-31 LAB — LIPASE, BLOOD: Lipase: 41 U/L (ref 11–51)

## 2022-01-31 MED ORDER — IOHEXOL 300 MG/ML  SOLN
100.0000 mL | Freq: Once | INTRAMUSCULAR | Status: AC | PRN
  Administered 2022-01-31: 100 mL via INTRAVENOUS

## 2022-01-31 MED ORDER — IBUPROFEN 800 MG PO TABS
800.0000 mg | ORAL_TABLET | Freq: Three times a day (TID) | ORAL | 0 refills | Status: AC | PRN
Start: 2022-01-31 — End: ?

## 2022-01-31 MED ORDER — SODIUM CHLORIDE 0.9 % IV BOLUS
1000.0000 mL | Freq: Once | INTRAVENOUS | Status: AC
  Administered 2022-01-31: 1000 mL via INTRAVENOUS

## 2022-01-31 MED ORDER — OXYCODONE HCL 5 MG PO TABS
5.0000 mg | ORAL_TABLET | Freq: Once | ORAL | Status: AC
  Administered 2022-01-31: 5 mg via ORAL
  Filled 2022-01-31: qty 1

## 2022-01-31 MED ORDER — ONDANSETRON 4 MG PO TBDP: 4.0000 mg | ORAL_TABLET | Freq: Three times a day (TID) | ORAL | 0 refills | Status: AC | PRN
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
## 2022-01-31 NOTE — Discharge Instructions (Addendum)
-  Take Tylenol/ibuprofen as needed for pain.  You may take ondansetron as needed for nausea. ?-Follow-up with the gastroenterologist listed above, as discussed. ?-Return to the emergency department anytime if you begin to experience any new or worsening symptoms. ?

## 2022-01-31 NOTE — ED Provider Notes (Signed)
? ?[facility] ?Provider Note ? ? ? None  ?  (approximate) ? ? ?History  ? ?Chief Complaint ?Abdominal Pain ? ? ?HPI ?Colleen Mejia is a 51 y.o. female, history of chronic pain, fibromyalgia, anxiety, type 2 diabetes, depression, hyperlipidemia, schizoaffective disorder, hypothyroidism, cocaine abuse, presents to the emergency department for evaluation of abdominal pain.  Patient states that she has been experiencing pain along her left flank for the past 3 months, which tends to travel across the rest of her abdomen as well.  Additionally endorses intermittent nausea, bloating, belching, and discomfort.  No acute worsening recently. Denies chest pain, shortness of breath, fever/chills, urinary symptoms, vomiting, diarrhea, blood in stool, lightheadedness/dizziness, or headache. ? ?History Limitations: No limitations. ? ?  ? ? ?Physical Exam  ?Triage Vital Signs: ?ED Triage Vitals  ?Enc Vitals Group  ?   BP 01/31/22 1315 (!) 119/94  ?   Pulse Rate 01/31/22 1315 (!) 105  ?   Resp 01/31/22 1315 18  ?   Temp 01/31/22 1315 97.9 ?F (36.6 ?C)  ?   Temp Source 01/31/22 1315 Oral  ?   SpO2 01/31/22 1315 97 %  ?   Weight 01/31/22 1312 203 lb (92.1 kg)  ?   Height 01/31/22 1312 '5\' 4"'$  (1.626 m)  ?   Head Circumference --   ?   Peak Flow --   ?   Pain Score 01/31/22 1312 9  ?   Pain Loc --   ?   Pain Edu? --   ?   Excl. in [place]? --   ? ? ?Most recent vital signs: ?Vitals:  ? 01/31/22 1335 01/31/22 1525  ?BP: (!) 161/118 (!) 136/92  ?Pulse: (!) 108 82  ?Resp: (!) 21 19  ?Temp:    ?SpO2: 93% 100%  ? ? ?General: Awake, NAD.  ?Skin: Warm, dry.  ?CV: Good peripheral perfusion.  ?Resp: Normal effort.  ?Abd: Soft, non-tender. No distention.  ?Neuro: At baseline. No gross neurological deficits.  ?Other: No CVA tenderness bilaterally. ? ?Physical Exam ? ? ? ?ED Results / Procedures / Treatments  ?Labs ?(all labs ordered are listed, but only abnormal results are displayed) ?Labs Reviewed  ?COMPREHENSIVE METABOLIC  PANEL - Abnormal; Notable for the following components:  ?    Result Value  ? Glucose, Bld 258 (*)   ? ALT 45 (*)   ? All other components within normal limits  ?URINALYSIS, ROUTINE W REFLEX MICROSCOPIC - Abnormal; Notable for the following components:  ? Color, Urine YELLOW (*)   ? APPearance HAZY (*)   ? Glucose, UA >=500 (*)   ? Leukocytes,Ua SMALL (*)   ? All other components within normal limits  ?LIPASE, BLOOD  ?CBC  ?POC URINE PREG, ED  ? ? ? ?EKG ?Sinus rhythm, rate of 105, no ST segment changes, no AV blocks, no axis deviations, normal QRS interval. ? ? ?RADIOLOGY ? ?ED Provider Interpretation: I personally reviewed and interpreted the CT, no evidence of acute findings. ? ?CT Abdomen Pelvis W Contrast ? ?Result Date: 01/31/2022 ?CLINICAL DATA:  Flank pain for months. EXAM: CT ABDOMEN AND PELVIS WITH CONTRAST TECHNIQUE: Multidetector CT imaging of the abdomen and pelvis was performed using the standard protocol following bolus administration of intravenous contrast. RADIATION DOSE REDUCTION: This exam was performed according to the departmental dose-optimization program which includes automated exposure control, adjustment of the mA and/or kV according to patient size and/or use of iterative reconstruction technique. CONTRAST:  16m OMNIPAQUE IOHEXOL  300 MG/ML  SOLN COMPARISON:  Comparison made with September 09, 2019. FINDINGS: Lower chest: No effusion or consolidative changes. Hepatobiliary: Marked hepatic steatosis. Moderate hepatomegaly unchanged. Mildly lobular hepatic contours. Post cholecystectomy. Portal vein is patent. Hepatic veins are patent. Pancreas: Normal, without mass, inflammation or ductal dilatation. Spleen: Mild splenomegaly 13 cm greatest craniocaudal dimension. Adrenals/Urinary Tract: Adrenal glands are unremarkable. Symmetric renal enhancement. No sign of hydronephrosis. No suspicious renal lesion or perinephric stranding. Urinary bladder is grossly unremarkable. Stomach/Bowel: Normal,  no acute gastrointestinal process. Appendix is normal. Colonic diverticulosis. Vascular/Lymphatic: Aortic atherosclerosis. Mild to moderate no sign of aneurysm. Smooth contour of the IVC. There is no gastrohepatic or hepatoduodenal ligament lymphadenopathy. No retroperitoneal or mesenteric lymphadenopathy. No pelvic sidewall lymphadenopathy. Reproductive: Unremarkable. Other: No ascites. Musculoskeletal: No acute bone finding. No destructive bone process. Spinal degenerative changes. IMPRESSION: 1. No acute findings in the abdomen or pelvis. 2. Marked hepatic steatosis with moderate hepatomegaly, mildly lobular hepatic contours and mild splenomegaly. Correlate with any clinical or laboratory evidence of liver disease. 3. Mild aortic atherosclerosis. Aortic Atherosclerosis (ICD10-I70.0). Electronically Signed   By: Zetta Bills M.D.   On: 01/31/2022 14:20   ? ?PROCEDURES: ? ?Critical Care performed: None. ? ?Procedures ? ? ? ?MEDICATIONS ORDERED IN ED: ?Medications  ?sodium chloride 0.9 % bolus 1,000 mL (0 mLs Intravenous Stopped 01/31/22 1538)  ?iohexol (OMNIPAQUE) 300 MG/ML solution 100 mL (100 mLs Intravenous Contrast Given 01/31/22 1406)  ?oxyCODONE (Oxy IR/ROXICODONE) immediate release tablet 5 mg (5 mg Oral Given 01/31/22 1456)  ? ? ? ?IMPRESSION / MDM / ASSESSMENT AND PLAN / ED COURSE  ?I reviewed the triage vital signs and the nursing notes. ?             ?               ? ? ?Differential diagnosis includes, but is not limited to, IBS, gastroenteritis, appendicitis, nephrolithiasis, cystitis/pyelonephritis, cholecystitis, cholangitis, cholelithiasis, pancreatitis, gastric/peptic ulcers, musculoskeletal pain.  ? ?ED Course ?Patient appears well.  Vital signs within normal limits.  NAD. ? ?CBC shows no leukocytosis or anemia. ? ?CMP notable for elevated glucose at 258, otherwise no electrolyte abnormalities, transaminitis, or evidence of kidney injury. ? ?Lipase unremarkable at 41. ? ?Urinalysis notable for  elevated glucose, consistent with history of diabetes, but otherwise no evidence of infection. ? ?Assessment/Plan ?Given the patient's history, physical exam, and work-up, I do not suspect any serious or life-threatening pathology.  Lab work-up has been reassuring.  No acute findings on CT abdomen.  Unclear etiology of the patient's discomfort and symptoms at this time, though she appears very stable. We will have her follow-up with her primary care provider/gastroenterologist for further evaluation and management.  We will provide her with a prescription for ibuprofen and ondansetron in the meantime.  Discussed the plan with the patient, who expressed understanding and agreement.  Will discharge. ? ?Considered admission for this patient, but given the patient's stable vitals, reassuring lab work-up, and unremarkable CT, she is unlikely to benefit. ? ?Patient was provided with anticipatory guidance, return precautions, and educational material. Encouraged the patient to return to the emergency department at any time if they begin to experience any new or worsening symptoms.  ? ? ? ? ?FINAL CLINICAL IMPRESSION(S) / ED DIAGNOSES  ? ?Final diagnoses:  ?Generalized abdominal pain  ? ? ? ?Rx / DC Orders  ? ?ED Discharge Orders   ? ?      Ordered  ?  ibuprofen (ADVIL) 800 MG  tablet  Every 8 hours PRN       ? 01/31/22 1526  ?  ondansetron (ZOFRAN-ODT) 4 MG disintegrating tablet  Every 8 hours PRN       ? 01/31/22 1526  ? ?  ?  ? ?  ? ? ? ?Note:  This document was prepared using Dragon voice recognition software and may include unintentional dictation errors. ?  ?Teodoro Spray, [place] ?01/31/22 1927 ? ?  ?Nance Pear, MD ?02/01/22 1505 ? ?

## 2022-04-19 ENCOUNTER — Other Ambulatory Visit: Payer: Self-pay | Admitting: Internal Medicine

## 2022-04-19 DIAGNOSIS — Z1231 Encounter for screening mammogram for malignant neoplasm of breast: Secondary | ICD-10-CM

## 2022-05-30 ENCOUNTER — Other Ambulatory Visit: Payer: Self-pay

## 2022-05-30 ENCOUNTER — Ambulatory Visit: Payer: Medicare Other | Admitting: Gastroenterology

## 2022-06-21 ENCOUNTER — Other Ambulatory Visit
Admission: RE | Admit: 2022-06-21 | Discharge: 2022-06-21 | Disposition: A | Discharge status: Home / Self Care | Payer: Medicare Other | Source: Ambulatory Visit | Attending: Family Medicine | Admitting: Family Medicine

## 2022-06-21 DIAGNOSIS — R0602 Shortness of breath: Secondary | ICD-10-CM | POA: Insufficient documentation

## 2022-06-21 DIAGNOSIS — R5383 Other fatigue: Secondary | ICD-10-CM | POA: Insufficient documentation

## 2022-06-21 LAB — D-DIMER, QUANTITATIVE: D-Dimer, Quant: <0.27 ug/mL-FEU (ref 0.00–0.50)

## 2022-07-05 ENCOUNTER — Other Ambulatory Visit: Payer: Self-pay

## 2022-07-05 ENCOUNTER — Emergency Department: Payer: Medicare Other

## 2022-07-05 ENCOUNTER — Encounter: Payer: Self-pay | Admitting: Emergency Medicine

## 2022-07-05 ENCOUNTER — Emergency Department
Admission: EM | Admit: 2022-07-05 | Discharge: 2022-07-05 | Disposition: A | Discharge status: Home / Self Care | Payer: Medicare Other | Attending: Emergency Medicine | Admitting: Emergency Medicine

## 2022-07-05 DIAGNOSIS — R109 Unspecified abdominal pain: Secondary | ICD-10-CM | POA: Diagnosis present

## 2022-07-05 DIAGNOSIS — R1084 Generalized abdominal pain: Secondary | ICD-10-CM | POA: Diagnosis not present

## 2022-07-05 LAB — URINALYSIS, ROUTINE W REFLEX MICROSCOPIC
Bacteria, UA: NONE SEEN
Bilirubin Urine: NEGATIVE
Glucose, UA: NEGATIVE mg/dL
Hgb urine dipstick: NEGATIVE
Ketones, ur: NEGATIVE mg/dL
Leukocytes,Ua: NEGATIVE
Nitrite: POSITIVE — AB
Protein, ur: NEGATIVE mg/dL
Specific Gravity, Urine: 1.016 (ref 1.005–1.030)
pH: 5 (ref 5.0–8.0)

## 2022-07-05 LAB — COMPREHENSIVE METABOLIC PANEL
ALT: 34 U/L (ref 0–44)
AST: 22 U/L (ref 15–41)
Albumin: 4.2 g/dL (ref 3.5–5.0)
Alkaline Phosphatase: 56 U/L (ref 38–126)
Anion gap: 7 (ref 5–15)
BUN: 29 mg/dL — ABNORMAL HIGH (ref 6–20)
CO2: 24 mmol/L (ref 22–32)
Calcium: 9.6 mg/dL (ref 8.9–10.3)
Chloride: 107 mmol/L (ref 98–111)
Creatinine, Ser: 1.19 mg/dL — ABNORMAL HIGH (ref 0.44–1.00)
GFR, Estimated: 55 mL/min — ABNORMAL LOW (ref >60)
Glucose, Bld: 213 mg/dL — ABNORMAL HIGH (ref 70–99)
Potassium: 4 mmol/L (ref 3.5–5.1)
Sodium: 138 mmol/L (ref 135–145)
Total Bilirubin: 0.6 mg/dL (ref 0.3–1.2)
Total Protein: 7.5 g/dL (ref 6.5–8.1)

## 2022-07-05 LAB — CBC
HCT: 40.7 % (ref 36.0–46.0)
Hemoglobin: 13.6 g/dL (ref 12.0–15.0)
MCH: 28.3 pg (ref 26.0–34.0)
MCHC: 33.4 g/dL (ref 30.0–36.0)
MCV: 84.8 fL (ref 80.0–100.0)
Platelets: 233 10*3/uL (ref 150–400)
RBC: 4.8 MIL/uL (ref 3.87–5.11)
RDW: 12.6 % (ref 11.5–15.5)
WBC: 6.3 10*3/uL (ref 4.0–10.5)
nRBC: 0 % (ref 0.0–0.2)

## 2022-07-05 LAB — CBC WITH DIFFERENTIAL/PLATELET
Abs Immature Granulocytes: 0.01 10*3/uL (ref 0.00–0.07)
Basophils Absolute: 0 10*3/uL (ref 0.0–0.1)
Basophils Relative: 1 %
Eosinophils Absolute: 0.2 10*3/uL (ref 0.0–0.5)
Eosinophils Relative: 3 %
HCT: 41 % (ref 36.0–46.0)
Hemoglobin: 13.5 g/dL (ref 12.0–15.0)
Immature Granulocytes: 0 %
Lymphocytes Relative: 35 %
Lymphs Abs: 2.1 10*3/uL (ref 0.7–4.0)
MCH: 28.2 pg (ref 26.0–34.0)
MCHC: 32.9 g/dL (ref 30.0–36.0)
MCV: 85.6 fL (ref 80.0–100.0)
Monocytes Absolute: 0.4 10*3/uL (ref 0.1–1.0)
Monocytes Relative: 6 %
Neutro Abs: 3.4 10*3/uL (ref 1.7–7.7)
Neutrophils Relative %: 55 %
Platelets: 237 10*3/uL (ref 150–400)
RBC: 4.79 MIL/uL (ref 3.87–5.11)
RDW: 12.7 % (ref 11.5–15.5)
WBC: 6.1 10*3/uL (ref 4.0–10.5)
nRBC: 0 % (ref 0.0–0.2)

## 2022-07-05 LAB — LIPASE, BLOOD: Lipase: 41 U/L (ref 11–51)

## 2022-07-05 MED ORDER — ONDANSETRON HCL 4 MG/2ML IJ SOLN
4.0000 mg | Freq: Once | INTRAMUSCULAR | Status: AC
  Administered 2022-07-05: 4 mg via INTRAVENOUS
  Filled 2022-07-05: qty 2

## 2022-07-05 MED ORDER — IOHEXOL 300 MG/ML  SOLN
100.0000 mL | Freq: Once | INTRAMUSCULAR | Status: AC | PRN
Start: 2022-07-05 — End: 2022-07-05
  Administered 2022-07-05: 100 mL via INTRAVENOUS

## 2022-07-05 MED ORDER — HYDROCODONE-ACETAMINOPHEN 5-325 MG PO TABS: 1.0000 | ORAL_TABLET | Freq: Four times a day (QID) | ORAL | 0 refills | Status: AC | PRN

## 2022-07-05 MED ORDER — MORPHINE SULFATE (PF) 4 MG/ML IV SOLN
4.0000 mg | Freq: Once | INTRAVENOUS | Status: AC
  Administered 2022-07-05: 4 mg via INTRAVENOUS
  Filled 2022-07-05: qty 1

## 2022-07-05 NOTE — ED Provider Notes (Signed)
[facility] Provider Note    Event Date/Time   First MD Initiated Contact with Patient 07/05/22 1752     (approximate)   History   Abdominal Pain   HPI  Colleen Mejia is a 51 y.o. female patient complains of increasing abdominal pain and swelling for the last several days.  No nausea or vomiting.  Temperature of 99.  Sent here from [place] [facility] for further evaluation.  He has a history of a hernia of some type which she is not sure where it is and IBS.  The pain is reported as achy and burning even to touch the skin on the left side of the abdomen.      Physical Exam   Triage Vital Signs: ED Triage Vitals [07/05/22 1422]  Enc Vitals Group     BP (!) 152/101     Pulse Rate 78     Resp 18     Temp 99.1 F (37.3 C)     Temp Source Oral     SpO2 95 %     Weight 206 lb (93.4 kg)     Height '5\' 4"'$  (1.626 m)     Head Circumference      Peak Flow      Pain Score 10     Pain Loc      Pain Edu?      Excl. in [place]?     Most recent vital signs: Vitals:   07/05/22 1422 07/05/22 1830  BP: (!) 152/101 (!) 165/93  Pulse: 78 69  Resp: 18 18  Temp: 99.1 F (37.3 C) 98.8 F (37.1 C)  SpO2: 95% 100%     General: Awake, no distress. CV:  Good peripheral perfusion.  Heart regular rate and rhythm no audible murmurs Resp:  Normal effort.  Lungs are clear Abd:  No distention.  Patient reports pain on touching the left side of the abdomen to the right side is not tender.  Bowel sounds are slightly decreased    ED Results / rocedures / Treatments   Labs (all labs ordered are listed, but only abnormal results are displayed) Labs Reviewed  COMPREHENSIVE METABOLIC PANEL - Abnormal; Notable for the following components:      Result Value   Glucose, Bld 213 (*)    BUN 29 (*)    Creatinine, Ser 1.19 (*)    GFR, Estimated 55 (*)    All other components within normal limits  URINALYSIS, ROUTINE W REFLEX MICROSCOPIC - Abnormal; Notable for the  following components:   Color, Urine AMBER (*)    APPearance CLEAR (*)    Nitrite POSITIVE (*)    All other components within normal limits  LIPASE, BLOOD  CBC  CBC WITH DIFFERENTIAL/PLATELET  POC URINE PREG, ED     EKG     RADIOLOGY  CT read by radiology reviewed and interpreted by me shows no acute disease there is some fatty liver.  Liver is fairly prominent perhaps this is causing some of her abdominal increased size.  Patient says she has had shingles before and this is not shingles.  Otherwise I would have thought shingles could be causing her skin to be tender.  She does not have a rash.  PROCEDURES:  Critical Care performed:   Procedures   MEDICATIONS ORDERED IN ED: Medications  morphine (PF) 4 MG/ML injection 4 mg (4 mg Intravenous Given 07/05/22 1839)  ondansetron (ZOFRAN) injection 4 mg (4 mg Intravenous Given 07/05/22 1840)  iohexol (OMNIPAQUE) 300 MG/ML solution 100 mL (100 mLs Intravenous Contrast Given 07/05/22 1847)     IMPRESSION / MDM / ASSESSMENT AND PLAN / ED COURSE  I reviewed the triage vital signs and the nursing notes. I am not sure what is causing the patient's pain.  I do not see any sign of any hernia.  She was concerned about a history of a hernia.  Her lab work is good her urine shows no appreciable amount of white cells or red cells.  Her nitrite is positive but everything else looks good.  There is no bacteria seen.  Differential diagnosis includes, but is not limited to, pancreatitis volvulus intussusception colitis ulcerative colitis Crohn's disease etc. are all in the differential and none of these seem to be going on.  Patient's presentation is most consistent with acute complicated illness / injury requiring diagnostic workup.  The patient is on the cardiac monitor to evaluate for evidence of arrhythmia and/or significant heart rate changes.   FINAL CLINICAL IMPRESSION(S) / ED DIAGNOSES   Final diagnoses:  Generalized abdominal pain      Rx / DC Orders   ED Discharge Orders          Ordered    HYDROcodone-acetaminophen (NORCO/VICODIN) 5-325 MG tablet  Every 6 hours PRN        07/05/22 1910             Note:  This document was prepared using Dragon voice recognition software and may include unintentional dictation errors.   Nena Polio, MD 07/05/22 [phone]

## 2022-07-05 NOTE — ED Triage Notes (Signed)
Patient sent to ED from [facility]. Patient c/o abd pain- left sided. Hx of diverticulitis and hernia. Patient states she has nausea and chronic diarrhea due to IBS.

## 2022-07-05 NOTE — Discharge Instructions (Addendum)
The only thing that showed up on your CT scan is a fatty liver.  That should not make your skin hurt.  If you develop any rash please do not hesitate to come back for recheck.  Please follow-up with your doctor and I would also follow-up with gastroenterology.  Dr. Alice Reichert is on-call.  He can investigate your abdominal pain and swelling a little further. I have given you a little bit of Vicodin 1 pill 4 times a day if needed for the pain.  Do not take it with tramadol or Klonopin or any of the other medications that she has it can make you sleepy like the Zanaflex or Restoril.  Be careful can make you woozy.  Do not fall.  Do not drive on it because the placement will consider you an impaired driver if they catch you.

## 2022-07-05 NOTE — ED Provider Triage Note (Signed)
Emergency Medicine Provider Triage Evaluation Note  Colleen Mejia, a 51 y.o. female  was evaluated in triage.  Pt complains of left upper and lower abdominal pain.  Presents to the ED from [facility] with complaints abdominal pain.  She will report a subjective history of diverticulitis but no chart evidence of a diagnosis exist.  Patient has had a tummy tuck procedure, and history of hypertension, osteo-, GERD, DM, bipolar disorder, and chronic pain syndrome as well as opioid dependence.  She reports nausea as well as chronic diarrhea due to "IBS."  Review of Systems  Positive: Nausea Negative: FCS  Physical Exam  BP (!) 152/101 (BP Location: Left Arm)   Pulse 78   Temp 99.1 F (37.3 C) (Oral)   Resp 18   Ht '5\' 4"'$  (1.626 m)   Wt 93.4 kg   SpO2 95%   BMI 35.36 kg/m  Gen:   Awake, no distress   Resp:  Normal effort  MSK:   Moves extremities without difficulty  ABD:  Soft, nontender, normal bowel sounds  Medical Decision Making  Medically screening exam initiated at 2:39 PM.  Appropriate orders placed.  MARIN WISNER was informed that the remainder of the evaluation will be completed by another provider, this initial triage assessment does not replace that evaluation, and the importance of remaining in the ED until their evaluation is complete.  Patient to the ED for evaluation of left upper and lower quadrant abdominal discomfort for the last week.  She reports nausea without vomiting but does endorse chronic diarrhea.   Melvenia Needles, PA-C 07/05/22 1451

## 2022-10-24 ENCOUNTER — Other Ambulatory Visit: Payer: Self-pay

## 2022-10-24 ENCOUNTER — Emergency Department: Payer: Medicare Other

## 2022-10-24 ENCOUNTER — Emergency Department
Admission: EM | Admit: 2022-10-24 | Discharge: 2022-10-24 | Disposition: A | Discharge status: Home / Self Care | Payer: Medicare Other | Attending: Emergency Medicine | Admitting: Emergency Medicine

## 2022-10-24 DIAGNOSIS — I10 Essential (primary) hypertension: Secondary | ICD-10-CM | POA: Diagnosis not present

## 2022-10-24 DIAGNOSIS — I159 Secondary hypertension, unspecified: Secondary | ICD-10-CM | POA: Insufficient documentation

## 2022-10-24 DIAGNOSIS — Z7984 Long term (current) use of oral hypoglycemic drugs: Secondary | ICD-10-CM | POA: Diagnosis not present

## 2022-10-24 DIAGNOSIS — Z1152 Encounter for screening for COVID-19: Secondary | ICD-10-CM | POA: Insufficient documentation

## 2022-10-24 DIAGNOSIS — E1165 Type 2 diabetes mellitus with hyperglycemia: Secondary | ICD-10-CM | POA: Insufficient documentation

## 2022-10-24 DIAGNOSIS — R519 Headache, unspecified: Secondary | ICD-10-CM | POA: Diagnosis present

## 2022-10-24 LAB — RESP PANEL BY RT-PCR (RSV, FLU A&B, COVID)  RVPGX2
Influenza A by PCR: NEGATIVE
Influenza B by PCR: NEGATIVE
Resp Syncytial Virus by PCR: NEGATIVE
SARS Coronavirus 2 by RT PCR: NEGATIVE

## 2022-10-24 LAB — CBC WITH DIFFERENTIAL/PLATELET
Abs Immature Granulocytes: 0.04 10*3/uL (ref 0.00–0.07)
Basophils Absolute: 0 10*3/uL (ref 0.0–0.1)
Basophils Relative: 1 %
Eosinophils Absolute: 0.1 10*3/uL (ref 0.0–0.5)
Eosinophils Relative: 1 %
HCT: 46.5 % — ABNORMAL HIGH (ref 36.0–46.0)
Hemoglobin: 15.6 g/dL — ABNORMAL HIGH (ref 12.0–15.0)
Immature Granulocytes: 1 %
Lymphocytes Relative: 18 %
Lymphs Abs: 1.6 10*3/uL (ref 0.7–4.0)
MCH: 27.9 pg (ref 26.0–34.0)
MCHC: 33.5 g/dL (ref 30.0–36.0)
MCV: 83 fL (ref 80.0–100.0)
Monocytes Absolute: 0.4 10*3/uL (ref 0.1–1.0)
Monocytes Relative: 5 %
Neutro Abs: 6.6 10*3/uL (ref 1.7–7.7)
Neutrophils Relative %: 74 %
Platelets: 295 10*3/uL (ref 150–400)
RBC: 5.6 MIL/uL — ABNORMAL HIGH (ref 3.87–5.11)
RDW: 12.9 % (ref 11.5–15.5)
WBC: 8.7 10*3/uL (ref 4.0–10.5)
nRBC: 0 % (ref 0.0–0.2)

## 2022-10-24 LAB — COMPREHENSIVE METABOLIC PANEL
ALT: 51 U/L — ABNORMAL HIGH (ref 0–44)
AST: 37 U/L (ref 15–41)
Albumin: 5.1 g/dL — ABNORMAL HIGH (ref 3.5–5.0)
Alkaline Phosphatase: 71 U/L (ref 38–126)
Anion gap: 14 (ref 5–15)
BUN: 10 mg/dL (ref 6–20)
CO2: 20 mmol/L — ABNORMAL LOW (ref 22–32)
Calcium: 10.1 mg/dL (ref 8.9–10.3)
Chloride: 102 mmol/L (ref 98–111)
Creatinine, Ser: 0.8 mg/dL (ref 0.44–1.00)
GFR, Estimated: >60 mL/min (ref >60)
Glucose, Bld: 290 mg/dL — ABNORMAL HIGH (ref 70–99)
Potassium: 4.1 mmol/L (ref 3.5–5.1)
Sodium: 136 mmol/L (ref 135–145)
Total Bilirubin: 1 mg/dL (ref 0.3–1.2)
Total Protein: 8.4 g/dL — ABNORMAL HIGH (ref 6.5–8.1)

## 2022-10-24 LAB — CBG MONITORING, ED: Glucose-Capillary: 279 mg/dL — ABNORMAL HIGH (ref 70–99)

## 2022-10-24 LAB — D-DIMER, QUANTITATIVE: D-Dimer, Quant: 0.27 ug/mL-FEU (ref 0.00–0.50)

## 2022-10-24 LAB — TROPONIN I (HIGH SENSITIVITY): Troponin I (High Sensitivity): 4 ng/L (ref <18)

## 2022-10-24 MED ORDER — GLIMEPIRIDE 4 MG PO TABS: 4.0000 mg | ORAL_TABLET | Freq: Every day | ORAL | 0 refills | Status: DC

## 2022-10-24 MED ORDER — GLIMEPIRIDE 4 MG PO TABS
4.0000 mg | ORAL_TABLET | Freq: Every day | ORAL | 0 refills | Status: AC
Start: 2022-10-24 — End: 2022-12-23

## 2022-10-24 NOTE — ED Provider Notes (Signed)
[facility] Provider Note    None    (approximate)   History   Chief Complaint Hypertension   HPI Colleen Mejia is a 51 y.o. female, history of chronic pain, hypertension, hyperlipidemia, GAD, type 2 diabetes, schizoaffective disorder, ADHD, GERD, presents to the emergency department for evaluation of hypertension.  She states that her blood pressure at home has been in the 190s over the past week.  She additionally states that she has been getting intermittent episodes of headache and chest pain for the past few months.  She states that she has not been compliant with her medications, but did take a 3 dose of losartan today.  She states that she is currently not feeling any pain or symptoms at this time.  Denies shortness of breath, fever/chills, abdominal pain, flank pain, nausea/vomiting, diarrhea, dizziness/lightheadedness, weakness, rash/lesions, or numbness/tingling in upper or lower extremities.  History Limitations: No limitations.        Physical Exam  Triage Vital Signs: ED Triage Vitals  Enc Vitals Group     BP 10/24/22 1500 (!) 154/104     Pulse Rate 10/24/22 1500 92     Resp 10/24/22 1500 17     Temp 10/24/22 1500 98.1 F (36.7 C)     Temp Source 10/24/22 1500 Oral     SpO2 10/24/22 1500 96 %     Weight 10/24/22 1506 210 lb (95.3 kg)     Height 10/24/22 1506 '5\' 4"'$  (1.626 m)     Head Circumference --      Peak Flow --      Pain Score 10/24/22 1505 9     Pain Loc --      Pain Edu? --      Excl. in [place]? --     Most recent vital signs: Vitals:   10/24/22 1500  BP: (!) 154/104  Pulse: 92  Resp: 17  Temp: 98.1 F (36.7 C)  SpO2: 96%    General: Awake, NAD.  Skin: Warm, dry. No rashes or lesions.  Eyes: PERRL. Conjunctivae normal.  CV: Good peripheral perfusion.  Resp: Normal effort.  Lung sounds clear bilaterally. Abd: Soft, non-tender. No distention.  Neuro: At baseline. No gross neurological deficits.  Musculoskeletal:  Normal ROM of all extremities.   Physical Exam    ED Results / Procedures / Treatments  Labs (all labs ordered are listed, but only abnormal results are displayed) Labs Reviewed  CBC WITH DIFFERENTIAL/PLATELET - Abnormal; Notable for the following components:      Result Value   RBC 5.60 (*)    Hemoglobin 15.6 (*)    HCT 46.5 (*)    All other components within normal limits  COMPREHENSIVE METABOLIC PANEL - Abnormal; Notable for the following components:   CO2 20 (*)    Glucose, Bld 290 (*)    Total Protein 8.4 (*)    Albumin 5.1 (*)    ALT 51 (*)    All other components within normal limits  CBG MONITORING, ED - Abnormal; Notable for the following components:   Glucose-Capillary 279 (*)    All other components within normal limits  RESP PANEL BY RT-PCR (RSV, FLU A&B, COVID)  [id number]  D-DIMER, QUANTITATIVE  TROPONIN I (HIGH SENSITIVITY)  TROPONIN I (HIGH SENSITIVITY)     EKG Sinus rhythm, rate of 95, no segment changes, no axis deviations, normal QRS, no QT prolongation.    RADIOLOGY  ED Provider Interpretation: I personally reviewed and interpreted this head  CT, no evidence of acute intracranial abnormalities.  CT Head Wo Contrast  Result Date: 10/24/2022 CLINICAL DATA:  Headache. EXAM: CT HEAD WITHOUT CONTRAST TECHNIQUE: Contiguous axial images were obtained from the base of the skull through the vertex without intravenous contrast. RADIATION DOSE REDUCTION: This exam was performed according to the departmental dose-optimization program which includes automated exposure control, adjustment of the mA and/or kV according to patient size and/or use of iterative reconstruction technique. COMPARISON:  08/03/12 CT Brain FINDINGS: Brain: No evidence of acute infarction, hemorrhage, hydrocephalus, extra-axial collection or mass lesion/mass effect. Vascular: No hyperdense vessel or unexpected calcification. Skull: Normal. Negative for fracture or focal lesion. Sinuses/Orbits: Mild  mucosal thickening of the floor of bilateral maxillary sinuses. The sinuses and mastoid air cells are otherwise clear. Other: None. IMPRESSION: No acute intracranial abnormality identified. Electronically Signed   By: Marin Roberts M.D.   On: 10/24/2022 15:34    PROCEDURES:  Critical Care performed: N/A.  Procedures    MEDICATIONS ORDERED IN ED: Medications - No data to display   IMPRESSION / MDM / [place] / ED COURSE  I reviewed the triage vital signs and the nursing notes.                              Differential diagnosis includes, but is not limited to, ACS, hypertension, hyperglycemia, hypertensive emergency, anemia, pericarditis/myocarditis.  ED Course Patient appears well, vitals within normal limits.  NAD.  CBC shows no leukocytosis, anemia, or thrombocytopenia.  CMP shows markedly elevated glucose at 290.  She states that she is on glimepiride, though has not been compliant with her medication over the past several weeks.  She states that she needs a refill.  No transaminitis or electrolyte abnormalities.  No AKI.  Respiratory panel negative for COVID-19, influenza, or RSV.  D-dimer unremarkable at 0.27  Initial troponin 4.  Consistent with previous values.  Assessment/Plan Patient presents with concern for hypertension x 1 week and chest pain/headache intermittently over the past few months.  She appears well clinically.  Vital signs are within normal limits.  Her blood pressure is somewhat elevated at 154/104, though may also be attributed to her recent intake of her losartan.  She states that she has not been consistent with most of her medications.  EKG is unremarkable.  Initial troponin is 4, which is consistent with her previous values.  I did recommend a repeat troponin, however she states that she is ready to go home now.  Very low suspicion for ACS or myocarditis/pericarditis.  D-dimer is also unremarkable.  Low suspicion for pulmonary embolism.  I do  not believe that she has any serious or life-threatening pathology going on at this time.  Advised her to follow-up with her primary care provider/cardiologist sometime within the next week.  She states that she has an appointment with her PCP soon.  In regards to her glucose, it is markedly elevated today at 290.  No anion gap or other signs suggestive of DKA/HHNS.  Did offer IV fluids and treatment for her hyperglycemia, however she states that she does not want any treatment at this time, but did request a refill for her glimepiride.  Will provide.  Will discharge.    Considered admission for this patient, but given her stable presentation and access to close follow-up, she will likely benefit from admission.  Provided the patient with anticipatory guidance, return precautions, and educational material. Encouraged the patient  to return to the emergency department at any time if they begin to experience any new or worsening symptoms. Patient expressed understanding and agreed with the plan.   Patient's presentation is most consistent with acute complicated illness / injury requiring diagnostic workup.       FINAL CLINICAL IMPRESSION(S) / ED DIAGNOSES   Final diagnoses:  Secondary hypertension     Rx / DC Orders   ED Discharge Orders          Ordered    glimepiride (AMARYL) 4 MG tablet  Daily,   Status:  Discontinued        10/24/22 1657    glimepiride (AMARYL) 4 MG tablet  Daily        10/24/22 1715             Note:  This document was prepared using Dragon voice recognition software and may include unintentional dictation errors.   Teodoro Spray, [place] 10/24/22 1816    Arta Silence, MD 10/24/22 [phone]

## 2022-10-24 NOTE — ED Notes (Addendum)
Pt reports has DM and glucometer at home to check not working due to lack of batteries; states blurred vision and episodes of falling at home recently, pt unsure what causes her to fall; states feels SOB currently; pt reports intermittent nausea; pt's resp reg/unlabored currently, skin dry and sitting calmly in chair.

## 2022-10-24 NOTE — ED Notes (Signed)
Attempted for blood x1.

## 2022-10-24 NOTE — Discharge Instructions (Addendum)
-  Please follow up with your regular doctor as discussed.   -Please take all of your medications as prescribed.   -Return to the emergency department at any time if you begin to experience any new or worsening symptoms.

## 2022-10-24 NOTE — ED Triage Notes (Signed)
Pt in from home due to high BP with HA and CP; pt reports took extra of her BP meds without relief of HTN; pt's speech currently clear; steady upon ambulation; visitor with pt.

## 2022-10-24 NOTE — ED Provider Triage Note (Signed)
  Emergency Medicine Provider Triage Evaluation Note  Colleen Mejia , a 51 y.o.female,  was evaluated in triage.  Pt complains of hypertension, headache, chest pain.  She states that she took extra of her blood pressure medications without relief in her hypertension.  Denies any other symptoms.   Review of Systems  Positive: Hypertension, chest pain, headache Negative: Denies fever, abdominal pain, vomiting  Physical Exam   Vitals:   10/24/22 1500  BP: (!) 154/104  Pulse: 92  Resp: 17  Temp: 98.1 F (36.7 C)  SpO2: 96%   Gen:   Awake, no distress   Resp:  Normal effort  MSK:   Moves extremities without difficulty  Other:    Medical Decision Making  Given the patient's initial medical screening exam, the following diagnostic evaluation has been ordered. The patient will be placed in the appropriate treatment space, once one is available, to complete the evaluation and treatment. I have discussed the plan of care with the patient and I have advised the patient that an ED physician or mid-level practitioner will reevaluate their condition after the test results have been received, as the results may give them additional insight into the type of treatment they may need.    Diagnostics: Labs, EKG, head CT, respiratory panel.  Treatments: none immediately   Teodoro Spray, [place] 10/24/22 [phone]

## 2023-01-16 DIAGNOSIS — E1165 Type 2 diabetes mellitus with hyperglycemia: Secondary | ICD-10-CM | POA: Diagnosis not present

## 2023-01-16 DIAGNOSIS — Z0000 Encounter for general adult medical examination without abnormal findings: Secondary | ICD-10-CM | POA: Diagnosis not present

## 2023-01-16 DIAGNOSIS — E039 Hypothyroidism, unspecified: Secondary | ICD-10-CM | POA: Diagnosis not present

## 2023-01-16 DIAGNOSIS — Z23 Encounter for immunization: Secondary | ICD-10-CM | POA: Diagnosis not present

## 2023-01-16 DIAGNOSIS — I10 Essential (primary) hypertension: Secondary | ICD-10-CM | POA: Diagnosis not present

## 2023-01-24 DIAGNOSIS — E119 Type 2 diabetes mellitus without complications: Secondary | ICD-10-CM | POA: Diagnosis not present

## 2023-01-24 DIAGNOSIS — I10 Essential (primary) hypertension: Secondary | ICD-10-CM | POA: Diagnosis not present

## 2023-01-24 DIAGNOSIS — M797 Fibromyalgia: Secondary | ICD-10-CM | POA: Diagnosis not present

## 2023-01-24 DIAGNOSIS — E669 Obesity, unspecified: Secondary | ICD-10-CM | POA: Diagnosis not present

## 2023-01-24 DIAGNOSIS — F17210 Nicotine dependence, cigarettes, uncomplicated: Secondary | ICD-10-CM | POA: Diagnosis not present

## 2023-01-24 DIAGNOSIS — K219 Gastro-esophageal reflux disease without esophagitis: Secondary | ICD-10-CM | POA: Diagnosis not present

## 2023-01-24 DIAGNOSIS — Z23 Encounter for immunization: Secondary | ICD-10-CM | POA: Diagnosis not present

## 2023-01-24 DIAGNOSIS — Z0000 Encounter for general adult medical examination without abnormal findings: Secondary | ICD-10-CM | POA: Diagnosis not present

## 2023-01-24 DIAGNOSIS — Z6834 Body mass index (BMI) 34.0-34.9, adult: Secondary | ICD-10-CM | POA: Diagnosis not present

## 2023-02-21 DIAGNOSIS — E1165 Type 2 diabetes mellitus with hyperglycemia: Secondary | ICD-10-CM | POA: Diagnosis not present

## 2023-02-26 DIAGNOSIS — G4733 Obstructive sleep apnea (adult) (pediatric): Secondary | ICD-10-CM | POA: Diagnosis not present

## 2023-02-27 DIAGNOSIS — M797 Fibromyalgia: Secondary | ICD-10-CM | POA: Diagnosis not present

## 2023-02-27 DIAGNOSIS — E1165 Type 2 diabetes mellitus with hyperglycemia: Secondary | ICD-10-CM | POA: Diagnosis not present

## 2023-02-27 DIAGNOSIS — I10 Essential (primary) hypertension: Secondary | ICD-10-CM | POA: Diagnosis not present

## 2023-02-27 DIAGNOSIS — F331 Major depressive disorder, recurrent, moderate: Secondary | ICD-10-CM | POA: Diagnosis not present

## 2023-03-03 DIAGNOSIS — G894 Chronic pain syndrome: Secondary | ICD-10-CM | POA: Diagnosis not present

## 2023-03-03 DIAGNOSIS — M25511 Pain in right shoulder: Secondary | ICD-10-CM | POA: Diagnosis not present

## 2023-03-03 DIAGNOSIS — M25521 Pain in right elbow: Secondary | ICD-10-CM | POA: Diagnosis not present

## 2023-03-03 DIAGNOSIS — G8921 Chronic pain due to trauma: Secondary | ICD-10-CM | POA: Diagnosis not present

## 2023-03-03 DIAGNOSIS — E668 Other obesity: Secondary | ICD-10-CM | POA: Diagnosis not present

## 2023-03-03 DIAGNOSIS — F419 Anxiety disorder, unspecified: Secondary | ICD-10-CM | POA: Diagnosis not present

## 2023-03-03 DIAGNOSIS — E118 Type 2 diabetes mellitus with unspecified complications: Secondary | ICD-10-CM | POA: Diagnosis not present

## 2023-03-03 DIAGNOSIS — F258 Other schizoaffective disorders: Secondary | ICD-10-CM | POA: Diagnosis not present

## 2023-03-03 DIAGNOSIS — F3189 Other bipolar disorder: Secondary | ICD-10-CM | POA: Diagnosis not present

## 2023-04-02 DIAGNOSIS — F331 Major depressive disorder, recurrent, moderate: Secondary | ICD-10-CM | POA: Diagnosis not present

## 2023-04-02 DIAGNOSIS — M797 Fibromyalgia: Secondary | ICD-10-CM | POA: Diagnosis not present

## 2023-04-02 DIAGNOSIS — E1165 Type 2 diabetes mellitus with hyperglycemia: Secondary | ICD-10-CM | POA: Diagnosis not present

## 2023-04-02 DIAGNOSIS — I10 Essential (primary) hypertension: Secondary | ICD-10-CM | POA: Diagnosis not present

## 2023-04-03 DIAGNOSIS — J209 Acute bronchitis, unspecified: Secondary | ICD-10-CM | POA: Diagnosis not present

## 2023-04-03 DIAGNOSIS — J440 Chronic obstructive pulmonary disease with acute lower respiratory infection: Secondary | ICD-10-CM | POA: Diagnosis not present

## 2023-04-03 DIAGNOSIS — E119 Type 2 diabetes mellitus without complications: Secondary | ICD-10-CM | POA: Diagnosis not present

## 2023-04-03 DIAGNOSIS — M797 Fibromyalgia: Secondary | ICD-10-CM | POA: Diagnosis not present

## 2023-04-03 DIAGNOSIS — I10 Essential (primary) hypertension: Secondary | ICD-10-CM | POA: Diagnosis not present

## 2023-04-03 DIAGNOSIS — F339 Major depressive disorder, recurrent, unspecified: Secondary | ICD-10-CM | POA: Diagnosis not present

## 2023-04-30 ENCOUNTER — Other Ambulatory Visit: Payer: Self-pay | Admitting: Anesthesiology

## 2023-04-30 DIAGNOSIS — G8928 Other chronic postprocedural pain: Secondary | ICD-10-CM | POA: Diagnosis not present

## 2023-04-30 DIAGNOSIS — M25511 Pain in right shoulder: Secondary | ICD-10-CM | POA: Diagnosis not present

## 2023-04-30 DIAGNOSIS — M25521 Pain in right elbow: Secondary | ICD-10-CM | POA: Diagnosis not present

## 2023-04-30 DIAGNOSIS — M5416 Radiculopathy, lumbar region: Secondary | ICD-10-CM | POA: Diagnosis not present

## 2023-04-30 DIAGNOSIS — F419 Anxiety disorder, unspecified: Secondary | ICD-10-CM | POA: Diagnosis not present

## 2023-04-30 DIAGNOSIS — M542 Cervicalgia: Secondary | ICD-10-CM | POA: Diagnosis not present

## 2023-04-30 DIAGNOSIS — F1120 Opioid dependence, uncomplicated: Secondary | ICD-10-CM | POA: Diagnosis not present

## 2023-04-30 DIAGNOSIS — G894 Chronic pain syndrome: Secondary | ICD-10-CM | POA: Diagnosis not present

## 2023-04-30 DIAGNOSIS — G8921 Chronic pain due to trauma: Secondary | ICD-10-CM | POA: Diagnosis not present

## 2023-04-30 DIAGNOSIS — M545 Low back pain, unspecified: Secondary | ICD-10-CM

## 2023-04-30 DIAGNOSIS — M5011 Cervical disc disorder with radiculopathy,  high cervical region: Secondary | ICD-10-CM

## 2023-05-22 DIAGNOSIS — E1165 Type 2 diabetes mellitus with hyperglycemia: Secondary | ICD-10-CM | POA: Diagnosis not present

## 2023-05-26 DIAGNOSIS — G8929 Other chronic pain: Secondary | ICD-10-CM | POA: Diagnosis not present

## 2023-05-26 DIAGNOSIS — M542 Cervicalgia: Secondary | ICD-10-CM | POA: Diagnosis not present

## 2023-05-26 DIAGNOSIS — M47812 Spondylosis without myelopathy or radiculopathy, cervical region: Secondary | ICD-10-CM | POA: Diagnosis not present

## 2023-05-26 DIAGNOSIS — Z981 Arthrodesis status: Secondary | ICD-10-CM | POA: Diagnosis not present

## 2023-05-26 DIAGNOSIS — M5412 Radiculopathy, cervical region: Secondary | ICD-10-CM | POA: Diagnosis not present

## 2023-05-26 DIAGNOSIS — M79601 Pain in right arm: Secondary | ICD-10-CM | POA: Diagnosis not present

## 2023-05-26 DIAGNOSIS — M79602 Pain in left arm: Secondary | ICD-10-CM | POA: Diagnosis not present

## 2023-05-26 DIAGNOSIS — E1165 Type 2 diabetes mellitus with hyperglycemia: Secondary | ICD-10-CM | POA: Diagnosis not present

## 2023-05-29 ENCOUNTER — Other Ambulatory Visit: Payer: Self-pay | Admitting: Student

## 2023-05-29 DIAGNOSIS — M5412 Radiculopathy, cervical region: Secondary | ICD-10-CM

## 2023-05-29 DIAGNOSIS — F17210 Nicotine dependence, cigarettes, uncomplicated: Secondary | ICD-10-CM | POA: Diagnosis not present

## 2023-05-29 DIAGNOSIS — M47812 Spondylosis without myelopathy or radiculopathy, cervical region: Secondary | ICD-10-CM

## 2023-05-29 DIAGNOSIS — E782 Mixed hyperlipidemia: Secondary | ICD-10-CM | POA: Diagnosis not present

## 2023-05-29 DIAGNOSIS — Z6834 Body mass index (BMI) 34.0-34.9, adult: Secondary | ICD-10-CM | POA: Diagnosis not present

## 2023-05-29 DIAGNOSIS — E1165 Type 2 diabetes mellitus with hyperglycemia: Secondary | ICD-10-CM | POA: Diagnosis not present

## 2023-05-29 DIAGNOSIS — G8929 Other chronic pain: Secondary | ICD-10-CM

## 2023-05-29 DIAGNOSIS — E669 Obesity, unspecified: Secondary | ICD-10-CM | POA: Diagnosis not present

## 2023-05-29 DIAGNOSIS — F259 Schizoaffective disorder, unspecified: Secondary | ICD-10-CM | POA: Diagnosis not present

## 2023-05-29 DIAGNOSIS — E119 Type 2 diabetes mellitus without complications: Secondary | ICD-10-CM | POA: Diagnosis not present

## 2023-05-29 DIAGNOSIS — R32 Unspecified urinary incontinence: Secondary | ICD-10-CM | POA: Diagnosis not present

## 2023-05-29 DIAGNOSIS — J4530 Mild persistent asthma, uncomplicated: Secondary | ICD-10-CM | POA: Diagnosis not present

## 2023-05-29 DIAGNOSIS — F3341 Major depressive disorder, recurrent, in partial remission: Secondary | ICD-10-CM | POA: Diagnosis not present

## 2023-05-29 DIAGNOSIS — F339 Major depressive disorder, recurrent, unspecified: Secondary | ICD-10-CM | POA: Diagnosis not present

## 2023-05-29 DIAGNOSIS — I10 Essential (primary) hypertension: Secondary | ICD-10-CM | POA: Diagnosis not present

## 2023-05-29 DIAGNOSIS — Z981 Arthrodesis status: Secondary | ICD-10-CM

## 2023-05-29 DIAGNOSIS — M2550 Pain in unspecified joint: Secondary | ICD-10-CM | POA: Diagnosis not present

## 2023-05-29 DIAGNOSIS — K219 Gastro-esophageal reflux disease without esophagitis: Secondary | ICD-10-CM | POA: Diagnosis not present

## 2023-05-29 DIAGNOSIS — M797 Fibromyalgia: Secondary | ICD-10-CM | POA: Diagnosis not present

## 2023-05-29 DIAGNOSIS — I700 Atherosclerosis of aorta: Secondary | ICD-10-CM | POA: Diagnosis not present

## 2023-06-20 ENCOUNTER — Encounter: Payer: Self-pay | Admitting: Student

## 2023-06-24 ENCOUNTER — Ambulatory Visit
Admission: RE | Admit: 2023-06-24 | Discharge: 2023-06-24 | Disposition: A | Discharge status: Home / Self Care | Payer: Medicare HMO | Source: Ambulatory Visit | Attending: Student | Admitting: Student

## 2023-06-24 DIAGNOSIS — M4722 Other spondylosis with radiculopathy, cervical region: Secondary | ICD-10-CM | POA: Diagnosis not present

## 2023-06-24 DIAGNOSIS — G8929 Other chronic pain: Secondary | ICD-10-CM

## 2023-06-24 DIAGNOSIS — Z03818 Encounter for observation for suspected exposure to other biological agents ruled out: Secondary | ICD-10-CM | POA: Diagnosis not present

## 2023-06-24 DIAGNOSIS — M47812 Spondylosis without myelopathy or radiculopathy, cervical region: Secondary | ICD-10-CM

## 2023-06-24 DIAGNOSIS — Z981 Arthrodesis status: Secondary | ICD-10-CM

## 2023-06-24 DIAGNOSIS — R6889 Other general symptoms and signs: Secondary | ICD-10-CM | POA: Diagnosis not present

## 2023-06-24 DIAGNOSIS — M5412 Radiculopathy, cervical region: Secondary | ICD-10-CM

## 2023-09-10 DIAGNOSIS — K1379 Other lesions of oral mucosa: Secondary | ICD-10-CM | POA: Diagnosis not present

## 2023-09-10 DIAGNOSIS — M797 Fibromyalgia: Secondary | ICD-10-CM | POA: Diagnosis not present

## 2023-09-10 DIAGNOSIS — M2550 Pain in unspecified joint: Secondary | ICD-10-CM | POA: Diagnosis not present

## 2023-09-10 DIAGNOSIS — Z23 Encounter for immunization: Secondary | ICD-10-CM | POA: Diagnosis not present

## 2023-09-10 DIAGNOSIS — R6889 Other general symptoms and signs: Secondary | ICD-10-CM | POA: Diagnosis not present

## 2023-09-18 DIAGNOSIS — M542 Cervicalgia: Secondary | ICD-10-CM | POA: Diagnosis not present

## 2023-09-18 DIAGNOSIS — M545 Low back pain, unspecified: Secondary | ICD-10-CM | POA: Diagnosis not present

## 2023-09-18 DIAGNOSIS — G8929 Other chronic pain: Secondary | ICD-10-CM | POA: Diagnosis not present

## 2023-09-18 DIAGNOSIS — R6889 Other general symptoms and signs: Secondary | ICD-10-CM | POA: Diagnosis not present

## 2023-09-19 DIAGNOSIS — K148 Other diseases of tongue: Secondary | ICD-10-CM | POA: Diagnosis not present

## 2023-09-19 DIAGNOSIS — I10 Essential (primary) hypertension: Secondary | ICD-10-CM | POA: Diagnosis not present

## 2023-09-19 DIAGNOSIS — F319 Bipolar disorder, unspecified: Secondary | ICD-10-CM | POA: Diagnosis not present

## 2023-09-19 DIAGNOSIS — F339 Major depressive disorder, recurrent, unspecified: Secondary | ICD-10-CM | POA: Diagnosis not present

## 2023-09-19 DIAGNOSIS — J449 Chronic obstructive pulmonary disease, unspecified: Secondary | ICD-10-CM | POA: Diagnosis not present

## 2023-09-19 DIAGNOSIS — F901 Attention-deficit hyperactivity disorder, predominantly hyperactive type: Secondary | ICD-10-CM | POA: Diagnosis not present

## 2023-09-19 DIAGNOSIS — K0381 Cracked tooth: Secondary | ICD-10-CM | POA: Diagnosis not present

## 2023-09-19 DIAGNOSIS — R6889 Other general symptoms and signs: Secondary | ICD-10-CM | POA: Diagnosis not present

## 2023-09-19 DIAGNOSIS — R5383 Other fatigue: Secondary | ICD-10-CM | POA: Diagnosis not present

## 2023-09-19 DIAGNOSIS — Z79899 Other long term (current) drug therapy: Secondary | ICD-10-CM | POA: Diagnosis not present

## 2023-09-19 DIAGNOSIS — Z09 Encounter for follow-up examination after completed treatment for conditions other than malignant neoplasm: Secondary | ICD-10-CM | POA: Diagnosis not present

## 2023-09-19 DIAGNOSIS — E119 Type 2 diabetes mellitus without complications: Secondary | ICD-10-CM | POA: Diagnosis not present

## 2023-09-25 DIAGNOSIS — M5416 Radiculopathy, lumbar region: Secondary | ICD-10-CM | POA: Diagnosis not present

## 2023-09-25 DIAGNOSIS — R6889 Other general symptoms and signs: Secondary | ICD-10-CM | POA: Diagnosis not present

## 2023-09-25 DIAGNOSIS — M542 Cervicalgia: Secondary | ICD-10-CM | POA: Diagnosis not present

## 2023-09-25 DIAGNOSIS — M2560 Stiffness of unspecified joint, not elsewhere classified: Secondary | ICD-10-CM | POA: Diagnosis not present

## 2023-10-06 ENCOUNTER — Encounter (HOSPITAL_BASED_OUTPATIENT_CLINIC_OR_DEPARTMENT_OTHER): Payer: Self-pay | Admitting: Urology

## 2023-10-06 ENCOUNTER — Other Ambulatory Visit: Payer: Self-pay

## 2023-10-06 ENCOUNTER — Emergency Department (HOSPITAL_BASED_OUTPATIENT_CLINIC_OR_DEPARTMENT_OTHER)
Admission: EM | Admit: 2023-10-06 | Discharge: 2023-10-06 | Disposition: A | Discharge status: Home / Self Care | Payer: Medicare HMO | Attending: Emergency Medicine | Admitting: Emergency Medicine

## 2023-10-06 ENCOUNTER — Other Ambulatory Visit (HOSPITAL_BASED_OUTPATIENT_CLINIC_OR_DEPARTMENT_OTHER): Payer: Self-pay

## 2023-10-06 ENCOUNTER — Emergency Department (HOSPITAL_BASED_OUTPATIENT_CLINIC_OR_DEPARTMENT_OTHER): Payer: Medicare HMO

## 2023-10-06 DIAGNOSIS — K5733 Diverticulitis of large intestine without perforation or abscess with bleeding: Secondary | ICD-10-CM | POA: Diagnosis not present

## 2023-10-06 DIAGNOSIS — K5792 Diverticulitis of intestine, part unspecified, without perforation or abscess without bleeding: Secondary | ICD-10-CM | POA: Diagnosis not present

## 2023-10-06 DIAGNOSIS — J45909 Unspecified asthma, uncomplicated: Secondary | ICD-10-CM | POA: Insufficient documentation

## 2023-10-06 DIAGNOSIS — I10 Essential (primary) hypertension: Secondary | ICD-10-CM | POA: Insufficient documentation

## 2023-10-06 DIAGNOSIS — K5730 Diverticulosis of large intestine without perforation or abscess without bleeding: Secondary | ICD-10-CM | POA: Diagnosis not present

## 2023-10-06 DIAGNOSIS — E119 Type 2 diabetes mellitus without complications: Secondary | ICD-10-CM | POA: Insufficient documentation

## 2023-10-06 DIAGNOSIS — K529 Noninfective gastroenteritis and colitis, unspecified: Secondary | ICD-10-CM | POA: Diagnosis not present

## 2023-10-06 DIAGNOSIS — K625 Hemorrhage of anus and rectum: Secondary | ICD-10-CM | POA: Diagnosis present

## 2023-10-06 DIAGNOSIS — K449 Diaphragmatic hernia without obstruction or gangrene: Secondary | ICD-10-CM | POA: Diagnosis not present

## 2023-10-06 LAB — CBC
HCT: 41.2 % (ref 36.0–46.0)
Hemoglobin: 13.9 g/dL (ref 12.0–15.0)
MCH: 29 pg (ref 26.0–34.0)
MCHC: 33.7 g/dL (ref 30.0–36.0)
MCV: 86 fL (ref 80.0–100.0)
Platelets: 200 10*3/uL (ref 150–400)
RBC: 4.79 MIL/uL (ref 3.87–5.11)
RDW: 13.6 % (ref 11.5–15.5)
WBC: 10.6 10*3/uL — ABNORMAL HIGH (ref 4.0–10.5)
nRBC: 0 % (ref 0.0–0.2)

## 2023-10-06 LAB — LIPASE, BLOOD: Lipase: 43 U/L (ref 11–51)

## 2023-10-06 LAB — COMPREHENSIVE METABOLIC PANEL
ALT: 27 U/L (ref 0–44)
AST: 15 U/L (ref 15–41)
Albumin: 4.5 g/dL (ref 3.5–5.0)
Alkaline Phosphatase: 68 U/L (ref 38–126)
Anion gap: 11 (ref 5–15)
BUN: 17 mg/dL (ref 6–20)
CO2: 25 mmol/L (ref 22–32)
Calcium: 10.1 mg/dL (ref 8.9–10.3)
Chloride: 98 mmol/L (ref 98–111)
Creatinine, Ser: 0.76 mg/dL (ref 0.44–1.00)
GFR, Estimated: >60 mL/min (ref >60)
Glucose, Bld: 273 mg/dL — ABNORMAL HIGH (ref 70–99)
Potassium: 4.2 mmol/L (ref 3.5–5.1)
Sodium: 134 mmol/L — ABNORMAL LOW (ref 135–145)
Total Bilirubin: 0.5 mg/dL (ref <1.2)
Total Protein: 7.6 g/dL (ref 6.5–8.1)

## 2023-10-06 LAB — URINALYSIS, W/ REFLEX TO CULTURE (INFECTION SUSPECTED)
Bacteria, UA: NONE SEEN
Bilirubin Urine: NEGATIVE
Glucose, UA: 250 mg/dL — AB
Hgb urine dipstick: NEGATIVE
Ketones, ur: NEGATIVE mg/dL
Leukocytes,Ua: NEGATIVE
Nitrite: NEGATIVE
Protein, ur: NEGATIVE mg/dL
Specific Gravity, Urine: 1.01 (ref 1.005–1.030)
pH: 6 (ref 5.0–8.0)

## 2023-10-06 LAB — PROTIME-INR
INR: 0.9 (ref 0.8–1.2)
Prothrombin Time: 12.2 s (ref 11.4–15.2)

## 2023-10-06 LAB — OCCULT BLOOD X 1 CARD TO LAB, STOOL: Fecal Occult Bld: POSITIVE — AB

## 2023-10-06 MED ORDER — METRONIDAZOLE 500 MG PO TABS
500.0000 mg | ORAL_TABLET | Freq: Two times a day (BID) | ORAL | 0 refills | Status: DC
  Filled 2023-10-06: qty 14, 7d supply, fill #0

## 2023-10-06 MED ORDER — CIPROFLOXACIN HCL 500 MG PO TABS
500.0000 mg | ORAL_TABLET | Freq: Two times a day (BID) | ORAL | 0 refills | Status: DC
  Filled 2023-10-06: qty 14, 7d supply, fill #0

## 2023-10-06 MED ORDER — CIPROFLOXACIN HCL 500 MG PO TABS
500.0000 mg | ORAL_TABLET | Freq: Once | ORAL | Status: AC
  Administered 2023-10-06: 500 mg via ORAL
  Filled 2023-10-06: qty 1

## 2023-10-06 MED ORDER — IOHEXOL 300 MG/ML  SOLN
100.0000 mL | Freq: Once | INTRAMUSCULAR | Status: AC | PRN
  Administered 2023-10-06: 100 mL via INTRAVENOUS

## 2023-10-06 MED ORDER — MORPHINE SULFATE (PF) 4 MG/ML IV SOLN
4.0000 mg | Freq: Once | INTRAVENOUS | Status: AC
  Administered 2023-10-06: 4 mg via INTRAVENOUS
  Filled 2023-10-06: qty 1

## 2023-10-06 MED ORDER — ONDANSETRON HCL 4 MG/2ML IJ SOLN
4.0000 mg | Freq: Once | INTRAMUSCULAR | Status: AC
  Administered 2023-10-06: 4 mg via INTRAVENOUS
  Filled 2023-10-06: qty 2

## 2023-10-06 MED ORDER — CIPROFLOXACIN HCL 500 MG PO TABS: 500.0000 mg | ORAL_TABLET | Freq: Two times a day (BID) | ORAL | 0 refills | Status: AC

## 2023-10-06 MED ORDER — METRONIDAZOLE 500 MG PO TABS: 500.0000 mg | ORAL_TABLET | Freq: Two times a day (BID) | ORAL | 0 refills | Status: AC

## 2023-10-06 MED ORDER — SODIUM CHLORIDE 0.9 % IV BOLUS
500.0000 mL | Freq: Once | INTRAVENOUS | Status: AC
Start: 2023-10-06 — End: 2023-10-06
  Administered 2023-10-06: 500 mL via INTRAVENOUS

## 2023-10-06 MED ORDER — METRONIDAZOLE 500 MG PO TABS
500.0000 mg | ORAL_TABLET | Freq: Once | ORAL | Status: AC
  Administered 2023-10-06: 500 mg via ORAL
  Filled 2023-10-06: qty 1
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
## 2023-10-06 NOTE — ED Provider Notes (Signed)
  Physical Exam  BP (!) 140/85   Pulse 89   Temp 97.8 F (36.6 C)   Resp 16   Ht 5\' 4"  (1.626 m)   Wt 95.3 kg   SpO2 94%   BMI 36.06 kg/m   Physical Exam  Procedures  Procedures  ED Course / MDM    Medical Decision Making Amount and/or Complexity of Data Reviewed Labs: ordered. Radiology: ordered.  Risk Prescription drug management.   Received in signout.  Left side abdominal pain and GI bleeding.  Did have some bright red blood.  Blood work reassuring however CT scan shows likely diverticulitis.  Will treat with antibiotics.  Patient states she has an amoxicillin allergy so we will treat with Cipro and Flagyl.  Appears stable for discharge home.  Has PCP and GI that she can follow-up with       Benjiman Core, MD 10/06/23 1749

## 2023-10-06 NOTE — ED Triage Notes (Signed)
Pt in wheelchair to triage  Pt states sharp abdominal pain and rectal bleeding since 0100  Bright red bleeding noted

## 2023-11-03 DIAGNOSIS — M797 Fibromyalgia: Secondary | ICD-10-CM | POA: Diagnosis not present

## 2023-11-03 DIAGNOSIS — R768 Other specified abnormal immunological findings in serum: Secondary | ICD-10-CM | POA: Diagnosis not present

## 2023-11-03 DIAGNOSIS — R6889 Other general symptoms and signs: Secondary | ICD-10-CM | POA: Diagnosis not present

## 2023-11-03 DIAGNOSIS — Z5900 Homelessness unspecified: Secondary | ICD-10-CM | POA: Diagnosis not present

## 2023-11-03 DIAGNOSIS — R208 Other disturbances of skin sensation: Secondary | ICD-10-CM | POA: Diagnosis not present

## 2023-11-06 DIAGNOSIS — K5732 Diverticulitis of large intestine without perforation or abscess without bleeding: Secondary | ICD-10-CM | POA: Diagnosis not present

## 2023-11-07 DIAGNOSIS — E1165 Type 2 diabetes mellitus with hyperglycemia: Secondary | ICD-10-CM | POA: Diagnosis not present

## 2023-11-18 DIAGNOSIS — E1165 Type 2 diabetes mellitus with hyperglycemia: Secondary | ICD-10-CM | POA: Diagnosis not present

## 2023-12-09 DIAGNOSIS — Z79891 Long term (current) use of opiate analgesic: Secondary | ICD-10-CM | POA: Diagnosis not present

## 2023-12-09 DIAGNOSIS — I10 Essential (primary) hypertension: Secondary | ICD-10-CM | POA: Diagnosis not present

## 2023-12-09 DIAGNOSIS — G894 Chronic pain syndrome: Secondary | ICD-10-CM | POA: Diagnosis not present

## 2024-01-06 DIAGNOSIS — Z79891 Long term (current) use of opiate analgesic: Secondary | ICD-10-CM | POA: Diagnosis not present

## 2024-01-06 DIAGNOSIS — M2550 Pain in unspecified joint: Secondary | ICD-10-CM | POA: Diagnosis not present

## 2024-01-06 DIAGNOSIS — G894 Chronic pain syndrome: Secondary | ICD-10-CM | POA: Diagnosis not present

## 2024-01-06 DIAGNOSIS — M542 Cervicalgia: Secondary | ICD-10-CM | POA: Diagnosis not present

## 2024-01-06 DIAGNOSIS — M25569 Pain in unspecified knee: Secondary | ICD-10-CM | POA: Diagnosis not present

## 2024-01-30 DIAGNOSIS — M5431 Sciatica, right side: Secondary | ICD-10-CM | POA: Diagnosis not present

## 2024-01-30 DIAGNOSIS — E119 Type 2 diabetes mellitus without complications: Secondary | ICD-10-CM | POA: Diagnosis not present

## 2024-01-30 DIAGNOSIS — J449 Chronic obstructive pulmonary disease, unspecified: Secondary | ICD-10-CM | POA: Diagnosis not present

## 2024-01-30 DIAGNOSIS — R5381 Other malaise: Secondary | ICD-10-CM | POA: Diagnosis not present

## 2024-01-30 DIAGNOSIS — Z6833 Body mass index (BMI) 33.0-33.9, adult: Secondary | ICD-10-CM | POA: Diagnosis not present

## 2024-01-30 DIAGNOSIS — G4700 Insomnia, unspecified: Secondary | ICD-10-CM | POA: Diagnosis not present

## 2024-01-30 DIAGNOSIS — F988 Other specified behavioral and emotional disorders with onset usually occurring in childhood and adolescence: Secondary | ICD-10-CM | POA: Diagnosis not present

## 2024-01-30 DIAGNOSIS — F259 Schizoaffective disorder, unspecified: Secondary | ICD-10-CM | POA: Diagnosis not present

## 2024-01-30 DIAGNOSIS — R5383 Other fatigue: Secondary | ICD-10-CM | POA: Diagnosis not present

## 2024-01-30 DIAGNOSIS — Z0000 Encounter for general adult medical examination without abnormal findings: Secondary | ICD-10-CM | POA: Diagnosis not present

## 2024-02-02 ENCOUNTER — Other Ambulatory Visit (HOSPITAL_COMMUNITY): Payer: Self-pay | Admitting: Internal Medicine

## 2024-02-02 DIAGNOSIS — R110 Nausea: Secondary | ICD-10-CM

## 2024-02-02 DIAGNOSIS — R7989 Other specified abnormal findings of blood chemistry: Secondary | ICD-10-CM

## 2024-02-03 DIAGNOSIS — M25569 Pain in unspecified knee: Secondary | ICD-10-CM | POA: Diagnosis not present

## 2024-02-03 DIAGNOSIS — G890 Central pain syndrome: Secondary | ICD-10-CM | POA: Diagnosis not present

## 2024-02-03 DIAGNOSIS — Z79891 Long term (current) use of opiate analgesic: Secondary | ICD-10-CM | POA: Diagnosis not present

## 2024-02-03 DIAGNOSIS — M545 Low back pain, unspecified: Secondary | ICD-10-CM | POA: Diagnosis not present

## 2024-02-03 DIAGNOSIS — G894 Chronic pain syndrome: Secondary | ICD-10-CM | POA: Diagnosis not present

## 2024-02-10 ENCOUNTER — Ambulatory Visit (HOSPITAL_COMMUNITY)

## 2024-02-12 DIAGNOSIS — J449 Chronic obstructive pulmonary disease, unspecified: Secondary | ICD-10-CM | POA: Diagnosis not present

## 2024-02-12 DIAGNOSIS — I700 Atherosclerosis of aorta: Secondary | ICD-10-CM | POA: Diagnosis not present

## 2024-02-12 DIAGNOSIS — E1165 Type 2 diabetes mellitus with hyperglycemia: Secondary | ICD-10-CM | POA: Diagnosis not present

## 2024-02-12 DIAGNOSIS — F331 Major depressive disorder, recurrent, moderate: Secondary | ICD-10-CM | POA: Diagnosis not present

## 2024-02-12 DIAGNOSIS — I10 Essential (primary) hypertension: Secondary | ICD-10-CM | POA: Diagnosis not present

## 2024-02-13 ENCOUNTER — Ambulatory Visit (HOSPITAL_COMMUNITY)

## 2024-02-16 DIAGNOSIS — E1165 Type 2 diabetes mellitus with hyperglycemia: Secondary | ICD-10-CM | POA: Diagnosis not present

## 2024-02-16 DIAGNOSIS — R32 Unspecified urinary incontinence: Secondary | ICD-10-CM | POA: Diagnosis not present

## 2024-03-05 ENCOUNTER — Ambulatory Visit (HOSPITAL_COMMUNITY)

## 2024-03-05 ENCOUNTER — Encounter (HOSPITAL_COMMUNITY): Payer: Self-pay

## 2024-04-19 ENCOUNTER — Other Ambulatory Visit: Payer: Self-pay | Admitting: Anesthesiology

## 2024-04-19 DIAGNOSIS — M545 Low back pain, unspecified: Secondary | ICD-10-CM

## 2024-05-03 ENCOUNTER — Encounter: Payer: Self-pay | Admitting: Anesthesiology

## 2024-05-05 ENCOUNTER — Encounter: Payer: Self-pay | Admitting: Anesthesiology

## 2024-05-06 ENCOUNTER — Encounter: Payer: Self-pay | Admitting: Anesthesiology

## 2024-05-10 ENCOUNTER — Encounter: Payer: Self-pay | Admitting: Anesthesiology

## 2024-05-10 ENCOUNTER — Encounter: Payer: Self-pay | Admitting: Internal Medicine

## 2024-05-10 ENCOUNTER — Other Ambulatory Visit: Payer: Self-pay | Admitting: Internal Medicine

## 2024-05-10 DIAGNOSIS — M47897 Other spondylosis, lumbosacral region: Secondary | ICD-10-CM

## 2024-05-10 DIAGNOSIS — R29818 Other symptoms and signs involving the nervous system: Secondary | ICD-10-CM

## 2024-05-13 ENCOUNTER — Encounter: Payer: Self-pay | Admitting: Internal Medicine

## 2024-05-14 ENCOUNTER — Other Ambulatory Visit
